# Patient Record
Sex: Male | Born: 1960 | Race: White | Hispanic: No | Marital: Married | Smoking: Never smoker
Health system: Southern US, Community
[De-identification: ages and names within clinical notes are randomized; demographics above are authoritative.]

## PROBLEM LIST (undated history)

## (undated) DIAGNOSIS — Z860100 Personal history of colon polyps, unspecified: Secondary | ICD-10-CM

## (undated) DIAGNOSIS — E039 Hypothyroidism, unspecified: Secondary | ICD-10-CM

## (undated) DIAGNOSIS — M1990 Unspecified osteoarthritis, unspecified site: Secondary | ICD-10-CM

## (undated) DIAGNOSIS — G709 Myoneural disorder, unspecified: Secondary | ICD-10-CM

## (undated) HISTORY — DX: Personal history of colon polyps, unspecified: Z86.0100

## (undated) HISTORY — PX: TONSILLECTOMY: SUR1361

## (undated) HISTORY — PX: WRIST SURGERY: SHX841

## (undated) HISTORY — DX: Myoneural disorder, unspecified: G70.9

## (undated) HISTORY — DX: Unspecified osteoarthritis, unspecified site: M19.90

## (undated) HISTORY — PX: ELBOW SURGERY: SHX618

## (undated) HISTORY — DX: Personal history of colonic polyps: Z86.010

---

## 2011-01-04 ENCOUNTER — Ambulatory Visit: Payer: Self-pay | Admitting: Internal Medicine

## 2011-02-15 ENCOUNTER — Ambulatory Visit: Payer: Self-pay | Admitting: General Surgery

## 2011-02-15 HISTORY — PX: COLONOSCOPY: SHX174

## 2011-02-18 LAB — PATHOLOGY REPORT

## 2012-08-13 ENCOUNTER — Encounter: Payer: Self-pay | Admitting: *Deleted

## 2015-01-29 HISTORY — PX: EYE SURGERY: SHX253

## 2015-05-01 ENCOUNTER — Other Ambulatory Visit: Payer: Self-pay

## 2015-05-01 ENCOUNTER — Encounter: Payer: Self-pay | Admitting: Physician Assistant

## 2015-05-01 ENCOUNTER — Ambulatory Visit (INDEPENDENT_AMBULATORY_CARE_PROVIDER_SITE_OTHER): Payer: BLUE CROSS/BLUE SHIELD | Admitting: Physician Assistant

## 2015-05-01 VITALS — BP 120/78 | HR 62 | Temp 98.3°F | Resp 14 | Wt 285.6 lb

## 2015-05-01 DIAGNOSIS — R220 Localized swelling, mass and lump, head: Secondary | ICD-10-CM | POA: Diagnosis not present

## 2015-05-01 MED ORDER — AMOXICILLIN-POT CLAVULANATE 875-125 MG PO TABS: 1.0000 | ORAL_TABLET | Freq: Two times a day (BID) | ORAL | Status: DC

## 2015-05-01 NOTE — Progress Notes (Signed)
Patient ID: Mason Fisher, male   DOB: 24-Apr-1960, 55 y.o.   MRN: [id number]   Patient: Mason Fisher Male    DOB: 24-Apr-1960   55 y.o.   MRN: [id number] Visit Date: 05/01/2015  Today's Provider: Margaretann LovelessJennifer M Alianis Trimmer, PA-C   Chief Complaint  Patient presents with  . Mass   Subjective:    HPI Patient presents with a mass behind right ear X 2 days. Patient states the mass is painful to the touch. He does not remember hurting it or hitting his head. He denies any middle ear pain, hearing loss, dizziness, URI symptoms, fevers, chills, nausea or vomiting. Does have pain with turning head.   Previous Medications   LEVOTHYROXINE (SYNTHROID, LEVOTHROID) 75 MCG TABLET       No Known Allergies  Review of Systems  Constitutional: Negative.   HENT: Positive for ear pain (behind right ear). Negative for congestion, ear discharge, hearing loss, postnasal drip, sinus pressure, sneezing, sore throat and tinnitus.   Eyes: Negative.   Respiratory: Negative.   Cardiovascular: Negative.   Gastrointestinal: Negative.   Endocrine: Negative.   Genitourinary: Negative.   Musculoskeletal: Negative.   Skin: Negative.        Mass behind right ear   Allergic/Immunologic: Negative.   Neurological: Negative.   Hematological: Negative.   Psychiatric/Behavioral: Negative.     Social History  Substance Use Topics  . Smoking status: Never Smoker   . Smokeless tobacco: Never Used  . Alcohol Use: No   Objective:   BP 120/78 mmHg  Pulse 62  Temp(Src) 98.3 F (36.8 C) (Oral)  Resp 14  Wt 285 lb 9.6 oz (129.547 kg)  Physical Exam  Constitutional: He appears well-developed and well-nourished. No distress.  HENT:  Head: Normocephalic and atraumatic.    Right Ear: Hearing, tympanic membrane, external ear and ear canal normal. Tympanic membrane is not erythematous and not bulging. No middle ear effusion.  Left Ear: Hearing, tympanic membrane, external ear and ear canal normal. Tympanic membrane is  not erythematous and not bulging.  No middle ear effusion.  Nose: Nose normal. Right sinus exhibits no maxillary sinus tenderness and no frontal sinus tenderness. Left sinus exhibits no maxillary sinus tenderness and no frontal sinus tenderness.  Mouth/Throat: Uvula is midline, oropharynx is clear and moist and mucous membranes are normal. No oropharyngeal exudate, posterior oropharyngeal edema or posterior oropharyngeal erythema.  Eyes: Conjunctivae and EOM are normal. Pupils are equal, round, and reactive to light. Right eye exhibits no discharge. Left eye exhibits no discharge.  Neck: Normal range of motion. Neck supple. No tracheal deviation present. No Brudzinski's sign and no Kernig's sign noted. No thyromegaly present.  Cardiovascular: Normal rate, regular rhythm and normal heart sounds.  Exam reveals no gallop and no friction rub.   No murmur heard. Pulmonary/Chest: Effort normal and breath sounds normal. No stridor. No respiratory distress. He has no wheezes. He has no rales.  Lymphadenopathy:    He has no cervical adenopathy.  Skin: Skin is warm and dry. He is not diaphoretic.  Vitals reviewed.       Assessment & Plan:     1. Localized swelling, mass, and lump of head Possible infectious process since lymph node is also enlarged.  Will give augmentin as below. I did advise him he may take IBU for pain and inflammation. Advised to call Dr. Arlana Pouchate by Thursday if still swollen for consideration of US for further evaluation.  - amoxicillin-clavulanate (AUGMENTIN) 875-125 MG tablet; Take 1  tablet by mouth 2 (two) times daily.  Dispense: 14 tablet; Refill: 0   Follow up: No Follow-up on file.

## 2015-05-01 NOTE — Patient Instructions (Signed)
Amoxicillin; Clavulanic Acid tablets  What is this medicine?  AMOXICILLIN; CLAVULANIC ACID (a mox i SIL in; KLAV yoo lan ic AS id) is a penicillin antibiotic. It is used to treat certain kinds of bacterial infections. It will not work for colds, flu, or other viral infections.  This medicine may be used for other purposes; ask your health care provider or pharmacist if you have questions.  What should I tell my health care provider before I take this medicine?  They need to know if you have any of these conditions:  -bowel disease, like colitis  -kidney disease  -liver disease  -mononucleosis  -an unusual or allergic reaction to amoxicillin, penicillin, cephalosporin, other antibiotics, clavulanic acid, other medicines, foods, dyes, or preservatives  -pregnant or trying to get pregnant  -breast-feeding  How should I use this medicine?  Take this medicine by mouth with a full glass of water. Follow the directions on the prescription label. Take at the start of a meal. Do not crush or chew. If the tablet has a score line, you may cut it in half at the score line for easier swallowing. Take your medicine at regular intervals. Do not take your medicine more often than directed. Take all of your medicine as directed even if you think you are better. Do not skip doses or stop your medicine early.  Talk to your pediatrician regarding the use of this medicine in children. Special care may be needed.  Overdosage: If you think you have taken too much of this medicine contact a poison control center or emergency room at once.  NOTE: This medicine is only for you. Do not share this medicine with others.  What if I miss a dose?  If you miss a dose, take it as soon as you can. If it is almost time for your next dose, take only that dose. Do not take double or extra doses.  What may interact with this medicine?  -allopurinol  -anticoagulants  -birth control pills  -methotrexate  -probenecid  This list may not describe all possible  interactions. Give your health care provider a list of all the medicines, herbs, non-prescription drugs, or dietary supplements you use. Also tell them if you smoke, drink alcohol, or use illegal drugs. Some items may interact with your medicine.  What should I watch for while using this medicine?  Tell your doctor or health care professional if your symptoms do not improve.  Do not treat diarrhea with over the counter products. Contact your doctor if you have diarrhea that lasts more than 2 days or if it is severe and watery.  If you have diabetes, you may get a false-positive result for sugar in your urine. Check with your doctor or health care professional.  Birth control pills may not work properly while you are taking this medicine. Talk to your doctor about using an extra method of birth control.  What side effects may I notice from receiving this medicine?  Side effects that you should report to your doctor or health care professional as soon as possible:  -allergic reactions like skin rash, itching or hives, swelling of the face, lips, or tongue  -breathing problems  -dark urine  -fever or chills, sore throat  -redness, blistering, peeling or loosening of the skin, including inside the mouth  -seizures  -trouble passing urine or change in the amount of urine  -unusual bleeding, bruising  -unusually weak or tired  -white patches or sores in the mouth   or throat  Side effects that usually do not require medical attention (report to your doctor or health care professional if they continue or are bothersome):  -diarrhea  -dizziness  -headache  -nausea, vomiting  -stomach upset  -vaginal or anal irritation  This list may not describe all possible side effects. Call your doctor for medical advice about side effects. You may report side effects to FDA at 1-800-FDA-1088.  Where should I keep my medicine?  Keep out of the reach of children.  Store at room temperature below 25 degrees C (77 degrees F). Keep container  tightly closed. Throw away any unused medicine after the expiration date.  NOTE: This sheet is a summary. It may not cover all possible information. If you have questions about this medicine, talk to your doctor, pharmacist, or health care provider.     © 2016, Elsevier/[employer] Standard. (2007-04-09 12:04:30)

## 2015-12-20 ENCOUNTER — Encounter: Payer: Self-pay | Admitting: *Deleted

## 2016-01-30 ENCOUNTER — Encounter: Payer: Self-pay | Admitting: *Deleted

## 2016-02-05 ENCOUNTER — Ambulatory Visit: Payer: Self-pay | Admitting: General Surgery

## 2016-02-19 ENCOUNTER — Ambulatory Visit: Payer: Self-pay | Admitting: General Surgery

## 2016-03-05 ENCOUNTER — Ambulatory Visit: Payer: Self-pay | Admitting: General Surgery

## 2016-08-15 ENCOUNTER — Encounter: Payer: Self-pay | Admitting: *Deleted

## 2016-08-20 ENCOUNTER — Ambulatory Visit: Payer: Self-pay | Admitting: General Surgery

## 2016-11-06 ENCOUNTER — Encounter: Payer: Self-pay | Admitting: *Deleted

## 2016-12-10 ENCOUNTER — Other Ambulatory Visit: Payer: Self-pay | Admitting: Internal Medicine

## 2016-12-10 DIAGNOSIS — K859 Acute pancreatitis without necrosis or infection, unspecified: Secondary | ICD-10-CM

## 2016-12-10 DIAGNOSIS — R748 Abnormal levels of other serum enzymes: Secondary | ICD-10-CM

## 2016-12-11 ENCOUNTER — Encounter: Payer: Self-pay | Admitting: *Deleted

## 2016-12-17 ENCOUNTER — Ambulatory Visit: Admission: RE | Admit: 2016-12-17 | Payer: BLUE CROSS/BLUE SHIELD | Source: Ambulatory Visit

## 2016-12-25 ENCOUNTER — Ambulatory Visit: Payer: BLUE CROSS/BLUE SHIELD | Admitting: General Surgery

## 2016-12-25 ENCOUNTER — Other Ambulatory Visit: Payer: Self-pay | Admitting: Internal Medicine

## 2016-12-25 ENCOUNTER — Encounter: Payer: Self-pay | Admitting: General Surgery

## 2016-12-25 VITALS — BP 134/80 | HR 70 | Resp 14 | Ht 74.0 in | Wt 293.0 lb

## 2016-12-25 DIAGNOSIS — Z86010 Personal history of colonic polyps: Secondary | ICD-10-CM | POA: Insufficient documentation

## 2016-12-25 DIAGNOSIS — K859 Acute pancreatitis without necrosis or infection, unspecified: Secondary | ICD-10-CM

## 2016-12-25 DIAGNOSIS — R748 Abnormal levels of other serum enzymes: Secondary | ICD-10-CM

## 2016-12-25 MED ORDER — POLYETHYLENE GLYCOL 3350 17 GM/SCOOP PO POWD: ORAL | 0 refills | Status: DC

## 2016-12-25 NOTE — Patient Instructions (Signed)
Colonoscopy, Adult A colonoscopy is an exam to look at the entire large intestine. During the exam, a lubricated, bendable tube is inserted into the anus and then passed into the rectum, colon, and other parts of the large intestine. A colonoscopy is often done as a part of normal colorectal screening or in response to certain symptoms, such as anemia, persistent diarrhea, abdominal pain, and blood in the stool. The exam can help screen for and diagnose medical problems, including:  Tumors.  Polyps.  Inflammation.  Areas of bleeding.  Tell a health care provider about:  Any allergies you have.  All medicines you are taking, including vitamins, herbs, eye drops, creams, and over-the-counter medicines.  Any problems you or family members have had with anesthetic medicines.  Any blood disorders you have.  Any surgeries you have had.  Any medical conditions you have.  Any problems you have had passing stool. What are the risks? Generally, this is a safe procedure. However, problems may occur, including:  Bleeding.  A tear in the intestine.  A reaction to medicines given during the exam.  Infection (rare).  What happens before the procedure? Eating and drinking restrictions Follow instructions from your health care provider about eating and drinking, which may include:  A few days before the procedure - follow a low-fiber diet. Avoid nuts, seeds, dried fruit, raw fruits, and vegetables.  1-3 days before the procedure - follow a clear liquid diet. Drink only clear liquids, such as clear broth or bouillon, black coffee or tea, clear juice, clear soft drinks or sports drinks, gelatin dessert, and popsicles. Avoid any liquids that contain red or purple dye.  On the day of the procedure - do not eat or drink anything during the 2 hours before the procedure, or within the time period that your health care provider recommends.  Bowel prep If you were prescribed an oral bowel prep  to clean out your colon:  Take it as told by your health care provider. Starting the day before your procedure, you will need to drink a large amount of medicated liquid. The liquid will cause you to have multiple loose stools until your stool is almost clear or light green.  If your skin or anus gets irritated from diarrhea, you may use these to relieve the irritation: ? Medicated wipes, such as adult wet wipes with aloe and vitamin E. ? A skin soothing-product like petroleum jelly.  If you vomit while drinking the bowel prep, take a break for up to 60 minutes and then begin the bowel prep again. If vomiting continues and you cannot take the bowel prep without vomiting, call your health care provider.  General instructions  Ask your health care provider about changing or stopping your regular medicines. This is especially important if you are taking diabetes medicines or blood thinners.  Plan to have someone take you home from the hospital or clinic. What happens during the procedure?  An IV tube may be inserted into one of your veins.  You will be given medicine to help you relax (sedative).  To reduce your risk of infection: ? Your health care team will wash or sanitize their hands. ? Your anal area will be washed with soap.  You will be asked to lie on your side with your knees bent.  Your health care provider will lubricate a long, thin, flexible tube. The tube will have a camera and a light on the end.  The tube will be inserted into your   anus.  The tube will be gently eased through your rectum and colon.  Air will be delivered into your colon to keep it open. You may feel some pressure or cramping.  The camera will be used to take images during the procedure.  A small tissue sample may be removed from your body to be examined under a microscope (biopsy). If any potential problems are found, the tissue will be sent to a lab for testing.  If small polyps are found, your  health care provider may remove them and have them checked for cancer cells.  The tube that was inserted into your anus will be slowly removed. The procedure may vary among health care providers and hospitals. What happens after the procedure?  Your blood pressure, heart rate, breathing rate, and blood oxygen level will be monitored until the medicines you were given have worn off.  Do not drive for 24 hours after the exam.  You may have a small amount of blood in your stool.  You may pass gas and have mild abdominal cramping or bloating due to the air that was used to inflate your colon during the exam.  It is up to you to get the results of your procedure. Ask your health care provider, or the department performing the procedure, when your results will be ready. This information is not intended to replace advice given to you by your health care provider. Make sure you discuss any questions you have with your health care provider. Document Released: 01/12/2000 Document Revised: 11/15/2015 Document Reviewed: 03/28/2015 Elsevier Interactive Patient Education  2018 Elsevier Inc.  

## 2016-12-25 NOTE — Progress Notes (Signed)
Patient ID: Mason Fisher, male   DOB: 12/03/1960, 56 y.o.   MRN: [id number]  Chief Complaint  Patient presents with  . Colonoscopy    HPI Mason Fisher is a 56 y.o. male here today for a evaluation of a colonoscopy. Last colonoscopy was done 02/15/2011.  The patient reports he had 2 episodes of abdominal bloating associated with watery stools occurring every 15 minutes. During these episodes he did not appreciate any blood or mucus. The first episode lasted 4-5 days, he was well for a week or more and then had a recurrent episode that lasted a somewhat shorter interval. No associated nausea or vomiting.     HPI  Past Medical History:  Diagnosis Date  . Personal history of colonic polyps     Past Surgical History:  Procedure Laterality Date  . COLONOSCOPY  02/15/2011  . ELBOW SURGERY Right   . EYE SURGERY  2017  . TONSILLECTOMY    . WRIST SURGERY Right     History reviewed. No pertinent family history.  Social History Social History   Tobacco Use  . Smoking status: Never Smoker  . Smokeless tobacco: Never Used  Substance Use Topics  . Alcohol use: No  . Drug use: No    No Known Allergies  Current Outpatient Medications  Medication Sig Dispense Refill  . levothyroxine (SYNTHROID, LEVOTHROID) 75 MCG tablet   2  . polyethylene glycol powder (GLYCOLAX/MIRALAX) powder 255 grams one bottle for colonoscopy prep 255 g 0   No current facility-administered medications for this visit.     Review of Systems Review of Systems  Constitutional: Negative.   Respiratory: Negative.   Cardiovascular: Negative.   Gastrointestinal: Positive for diarrhea (with associated upper abdominal bloating 2).    Blood pressure 134/80, pulse 70, resp. rate 14, height 6\' 2"  (1.88 m), weight 293 lb (132.9 kg).  Physical Exam Physical Exam  Constitutional: He is oriented to person, place, and time. He appears well-developed and well-nourished.  Cardiovascular: Normal rate, regular  rhythm and normal heart sounds.  Pulmonary/Chest: Effort normal and breath sounds normal.  Neurological: He is alert and oriented to person, place, and time.  Skin: Skin is warm and dry.    Data Reviewed 02/15/2011 colonoscopy: Diagnosis:  SIGMOID COLON POLYP HOT SNARED:  - TUBULAR ADENOMA.  - NEGATIVE FOR HIGH GRADE DYSPLASIA AND MALIGNANCY.  At the time of his endoscopy, the sessile polyp appeared to be 10 mm in diameter.  Assessment    History tubular adenoma in the sigmoid colon.  Recent episode of abdominal bloating associated with diarrhea. Infectious versus ischemic versus inflammatory process.    Plan         Colonoscopy with possible biopsy/polypectomy prn: Information regarding the procedure, including its potential risks and complications (including but not limited to perforation of the bowel, which may require emergency surgery to repair, and bleeding) was verbally given to the patient. Educational information regarding lower intestinal endoscopy was given to the patient. Written instructions for how to complete the bowel prep using Miralax were provided. The importance of drinking ample fluids to avoid dehydration as a result of the prep emphasized.  HPI, Physical Exam, Assessment and Plan have been scribed under the direction and in the presence of Donnalee CurryJeffrey Dominion Kathan, MD.  Ples SpecterJessica Qualls, CMA  I have completed the exam and reviewed the above documentation for accuracy and completeness.  I agree with the above.  [employer] has been used and any errors in dictation or transcription are  unintentional.  Donnalee CurryJeffrey Abigal Choung, M.D., F.A.C.S.    Earline MayotteByrnett, Nery Kalisz W 12/25/2016, 8:52 PM  [facility] Miralax prescription has been sent in to the patient's pharmacy today. Colonoscopy instructions have been reviewed with the patient. This patient is aware to call the office if they have further questions.   Mason Fisher, CMA

## 2016-12-30 ENCOUNTER — Ambulatory Visit
Admission: RE | Admit: 2016-12-30 | Discharge: 2016-12-30 | Disposition: A | Discharge status: Home / Self Care | Payer: BLUE CROSS/BLUE SHIELD | Source: Ambulatory Visit | Attending: Internal Medicine | Admitting: Internal Medicine

## 2016-12-30 DIAGNOSIS — K760 Fatty (change of) liver, not elsewhere classified: Secondary | ICD-10-CM | POA: Diagnosis not present

## 2016-12-30 DIAGNOSIS — K859 Acute pancreatitis without necrosis or infection, unspecified: Secondary | ICD-10-CM | POA: Diagnosis not present

## 2016-12-30 DIAGNOSIS — R109 Unspecified abdominal pain: Secondary | ICD-10-CM | POA: Diagnosis not present

## 2016-12-30 DIAGNOSIS — R748 Abnormal levels of other serum enzymes: Secondary | ICD-10-CM

## 2017-01-01 ENCOUNTER — Encounter: Payer: Self-pay | Admitting: General Surgery

## 2017-01-15 ENCOUNTER — Encounter: Payer: Self-pay | Admitting: *Deleted

## 2017-01-15 ENCOUNTER — Ambulatory Visit: Payer: BLUE CROSS/BLUE SHIELD | Admitting: Anesthesiology

## 2017-01-15 ENCOUNTER — Encounter
Admission: RE | Disposition: A | Discharge status: Home / Self Care | Payer: Self-pay | Source: Ambulatory Visit | Attending: General Surgery

## 2017-01-15 ENCOUNTER — Ambulatory Visit
Admission: RE | Admit: 2017-01-15 | Discharge: 2017-01-15 | Disposition: A | Discharge status: Home / Self Care | Payer: BLUE CROSS/BLUE SHIELD | Source: Ambulatory Visit | Attending: General Surgery | Admitting: General Surgery

## 2017-01-15 DIAGNOSIS — Z86010 Personal history of colonic polyps: Secondary | ICD-10-CM | POA: Diagnosis not present

## 2017-01-15 DIAGNOSIS — Z79899 Other long term (current) drug therapy: Secondary | ICD-10-CM | POA: Insufficient documentation

## 2017-01-15 DIAGNOSIS — E039 Hypothyroidism, unspecified: Secondary | ICD-10-CM | POA: Insufficient documentation

## 2017-01-15 DIAGNOSIS — K5730 Diverticulosis of large intestine without perforation or abscess without bleeding: Secondary | ICD-10-CM | POA: Insufficient documentation

## 2017-01-15 DIAGNOSIS — Z1211 Encounter for screening for malignant neoplasm of colon: Secondary | ICD-10-CM | POA: Diagnosis present

## 2017-01-15 HISTORY — PX: COLONOSCOPY WITH PROPOFOL: SHX5780

## 2017-01-15 HISTORY — DX: Hypothyroidism, unspecified: E03.9

## 2017-01-15 SURGERY — COLONOSCOPY WITH PROPOFOL
Anesthesia: General

## 2017-01-15 MED ORDER — SODIUM CHLORIDE 0.9 % IV SOLN
INTRAVENOUS | Status: DC
  Administered 2017-01-15: 1000 mL via INTRAVENOUS

## 2017-01-15 MED ORDER — PROPOFOL 500 MG/50ML IV EMUL
INTRAVENOUS | Status: AC
  Filled 2017-01-15: qty 50

## 2017-01-15 MED ORDER — FENTANYL CITRATE (PF) 100 MCG/2ML IJ SOLN
INTRAMUSCULAR | Status: AC
  Filled 2017-01-15: qty 2

## 2017-01-15 MED ORDER — PROPOFOL 500 MG/50ML IV EMUL
INTRAVENOUS | Status: DC | PRN
  Administered 2017-01-15: 160 ug/kg/min via INTRAVENOUS

## 2017-01-15 MED ORDER — LIDOCAINE 2% (20 MG/ML) 5 ML SYRINGE
INTRAMUSCULAR | Status: DC | PRN
  Administered 2017-01-15: 40 mg via INTRAVENOUS

## 2017-01-15 MED ORDER — PROPOFOL 10 MG/ML IV BOLUS
INTRAVENOUS | Status: DC | PRN
  Administered 2017-01-15: 100 mg via INTRAVENOUS

## 2017-01-15 MED ORDER — FENTANYL CITRATE (PF) 100 MCG/2ML IJ SOLN
INTRAMUSCULAR | Status: DC | PRN
Start: 2017-01-15 — End: 2017-01-15
  Administered 2017-01-15 (×2): 50 ug via INTRAVENOUS

## 2017-01-15 NOTE — Anesthesia Preprocedure Evaluation (Signed)
Anesthesia Evaluation  Patient identified by MRN, date of birth, ID band Patient awake    Reviewed: Allergy & Precautions, NPO status , Patient's Chart, lab work & pertinent test results  Airway Mallampati: II       Dental  (+) Teeth Intact   Pulmonary neg pulmonary ROS,    breath sounds clear to auscultation       Cardiovascular Exercise Tolerance: Good  Rhythm:Regular Rate:Normal     Neuro/Psych negative psych ROS   GI/Hepatic negative GI ROS, Neg liver ROS,   Endo/Other  Hypothyroidism Morbid obesity  Renal/GU negative Renal ROS     Musculoskeletal negative musculoskeletal ROS (+)   Abdominal (+) + obese,   Peds negative pediatric ROS (+)  Hematology   Anesthesia Other Findings   Reproductive/Obstetrics                             Anesthesia Physical Anesthesia Plan  ASA: II  Anesthesia Plan: General   Post-op Pain Management:    Induction: Intravenous  PONV Risk Score and Plan:   Airway Management Planned: Natural Airway and Nasal Cannula  Additional Equipment:   Intra-op Plan:   Post-operative Plan:   Informed Consent: I have reviewed the patients History and Physical, chart, labs and discussed the procedure including the risks, benefits and alternatives for the proposed anesthesia with the patient or authorized representative who has indicated his/her understanding and acceptance.     Plan Discussed with: CRNA  Anesthesia Plan Comments:         Anesthesia Quick Evaluation

## 2017-01-15 NOTE — Op Note (Signed)
[facility] Gastroenterology Patient Name: Mason CheeseDonald Ballow Procedure Date: 01/15/2017 10:51 AM MRN: [id number] Account #: [id number] Date of Birth: Jan 21, 1961 Admit Type: Outpatient Age: [id number] [facility]OM 1 Gender: Male Note Status: Finalized Procedure:            Colonoscopy Indications:          High risk colon cancer surveillance: Personal history                        of colonic polyps, Incidental - Clinically significant                        diarrhea of unexplained origin Providers:            Earline MayotteJeffrey W. Nataki Mccrumb, MD Referring MD:         Jillene Bucksenny C. Arlana Pouchate, MD (Referring MD) Medicines:            Monitored Anesthesia Care Complications:        No immediate complications. Procedure:            Pre-Anesthesia Assessment:                       - Prior to the procedure, a History and Physical was                        performed, and patient medications, allergies and                        sensitivities were reviewed. The patient's tolerance of                        previous anesthesia was reviewed.                       - The risks and benefits of the procedure and the                        sedation options and risks were discussed with the                        patient. All questions were answered and informed                        consent was obtained.                       After obtaining informed consent, the colonoscope was                        passed under direct vision. Throughout the procedure,                        the patient's blood pressure, pulse, and oxygen                        saturations were monitored continuously. The                        Colonoscope was introduced through the anus and  advanced to the the cecum, identified by appendiceal                        orifice and ileocecal valve. The colonoscopy was                        performed without difficulty. The colonoscopy was   somewhat difficult due to a tortuous colon. Successful                        completion of the procedure was aided by changing the                        patient to a supine position and using manual pressure.                        The patient tolerated the procedure well. The quality                        of the bowel preparation was excellent. Findings:      Many small-mouthed diverticula were found in the sigmoid colon,       transverse colon and hepatic flexure.      The retroflexed view of the distal rectum and anal verge was normal and       showed no anal or rectal abnormalities. Impression:           - Diverticulosis in the sigmoid colon, in the                        transverse colon and at the hepatic flexure.                       - The distal rectum and anal verge are normal on                        retroflexion view.                       - No specimens collected. Recommendation:       - Telephone endoscopist for pathology results in 1 week. Procedure Code(s):    --- Professional ---                       [phone], Colonoscopy, flexible; diagnostic, including                        collection of specimen(s) by brushing or washing, when                        performed (separate procedure) Diagnosis Code(s):    --- Professional ---                       K57.30, Diverticulosis of large intestine without                        perforation or abscess without bleeding                       Z86.010, Personal history of colonic polyps CPT copyright 2016 American Medical Association. All rights reserved. The codes  documented in this report are preliminary and upon coder review may  be revised to meet current compliance requirements. Earline MayotteJeffrey W. Hadley Soileau, MD 01/15/2017 11:31:30 AM This report has been signed electronically. Number of Addenda: 0 Note Initiated On: 01/15/2017 10:51 AM Scope Withdrawal Time: 0 hours 11 minutes 28 seconds  Total Procedure Duration: 0 hours 28 minutes  40 seconds       [facility]

## 2017-01-15 NOTE — Anesthesia Post-op Follow-up Note (Signed)
Anesthesia QCDR form completed.        

## 2017-01-15 NOTE — H&P (Signed)
Mason CheeseDonald Fisher [id number] 12-30-1960     HPI: 56 year old male who underwent a colonoscopy in 2013 with identification of tubular adenoma in the sigmoid colon.  Recently he has had 2 episodes of watery stools without mucus or blood.  Follow-up colonoscopy.  The patient reports tolerating the prep well.  Medications Prior to Admission  Medication Sig Dispense Refill Last Dose  . levothyroxine (SYNTHROID, LEVOTHROID) 75 MCG tablet   2 Taking  . polyethylene glycol powder (GLYCOLAX/MIRALAX) powder 255 grams one bottle for colonoscopy prep 255 g 0    No Known Allergies Past Medical History:  Diagnosis Date  . Hypothyroidism   . Personal history of colonic polyps    Past Surgical History:  Procedure Laterality Date  . COLONOSCOPY  02/15/2011  . ELBOW SURGERY Right   . EYE SURGERY  2017  . TONSILLECTOMY    . WRIST SURGERY Right    Social History   Socioeconomic History  . Marital status: Married    Spouse name: Not on file  . Number of children: Not on file  . Years of education: Not on file  . Highest education level: Not on file  Social Needs  . Financial resource strain: Not on file  . Food insecurity - worry: Not on file  . Food insecurity - inability: Not on file  . Transportation needs - medical: Not on file  . Transportation needs - non-medical: Not on file  Occupational History  . Not on file  Tobacco Use  . Smoking status: Never Smoker  . Smokeless tobacco: Never Used  Substance and Sexual Activity  . Alcohol use: No  . Drug use: No  . Sexual activity: Not on file  Other Topics Concern  . Not on file  Social History Narrative  . Not on file   Social History   Social History Narrative  . Not on file     ROS: Negative.     PE: HEENT: Negative. Lungs: Clear. Cardio: RR.   Assessment/Plan:  Proceed with planned endoscopy.  Earline MayotteByrnett, Algenis Ballin W 01/15/2017

## 2017-01-15 NOTE — Anesthesia Postprocedure Evaluation (Signed)
Anesthesia Post Note  Patient: Mason Fisher  Procedure(s) Performed: COLONOSCOPY WITH PROPOFOL (N/A )  Patient location during evaluation: PACU Anesthesia Type: General Level of consciousness: awake Pain management: pain level controlled Vital Signs Assessment: post-procedure vital signs reviewed and stable Respiratory status: spontaneous breathing Cardiovascular status: stable Anesthetic complications: no     Last Vitals:  Vitals:   01/15/17 1150 01/15/17 1200  BP: 116/70 125/81  Pulse: (!) 57 63  Resp: 10 (!) 8  Temp:    SpO2: 97% 96%    Last Pain:  Vitals:   01/15/17 1130  TempSrc: Tympanic                 VAN STAVEREN,Niemah Schwebke

## 2017-01-15 NOTE — Transfer of Care (Signed)
Immediate Anesthesia Transfer of Care Note  Patient: Mason Fisher  Procedure(s) Performed: COLONOSCOPY WITH PROPOFOL (N/A )  Patient Location: PACU and Endoscopy Unit  Anesthesia Type:General  Level of Consciousness: sedated  Airway & Oxygen Therapy: Patient Spontanous Breathing and Patient connected to nasal cannula oxygen  Post-op Assessment: Report given to RN and Post -op Vital signs reviewed and stable  Post vital signs: Reviewed and stable  Last Vitals:  Vitals:   01/15/17 1021 01/15/17 1130  BP: (!) 141/92 (P) 98/60  Pulse: 74 (P) 63  Resp: 17 (P) 18  Temp: 36.6 C (!) (P) 35.8 C  SpO2: 97% (P) 97%    Last Pain:  Vitals:   01/15/17 1130  TempSrc: (P) Tympanic         Complications: No apparent anesthesia complications

## 2017-01-16 ENCOUNTER — Encounter: Payer: Self-pay | Admitting: General Surgery

## 2017-01-17 LAB — SURGICAL PATHOLOGY

## 2017-01-18 ENCOUNTER — Telehealth: Payer: Self-pay | Admitting: General Surgery

## 2017-01-20 NOTE — Telephone Encounter (Signed)
Notified of benign biopsies. F/U exam in five years based on past colonoscopy showing adenomatous polyps.

## 2018-11-21 IMAGING — US US ABDOMEN COMPLETE
1 series · 14 of 25 positions shown · non-contrast
Comparison: 01/04/2011

CLINICAL DATA: Acute pancreatitis.  Elevated liver enzymes.

EXAM:
ABDOMEN ULTRASOUND COMPLETE

[Series 1: us abdomen complete · 0.23mm/px · 14 of 91 slices shown]
[im 1/91]
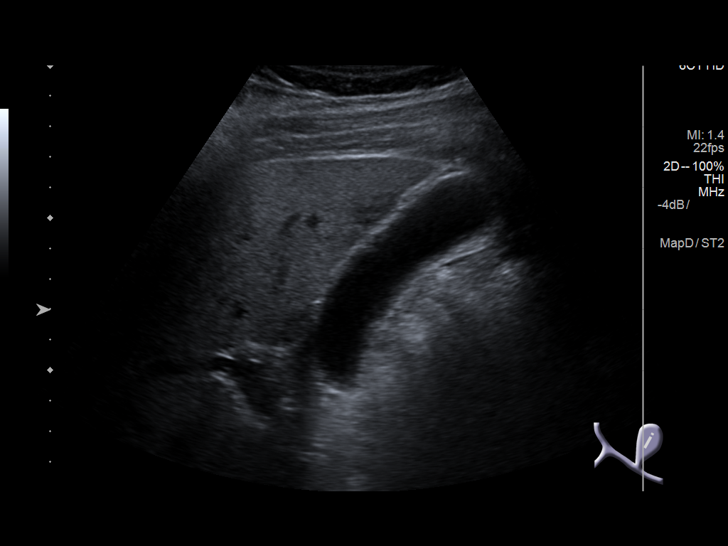
[im 8/91]
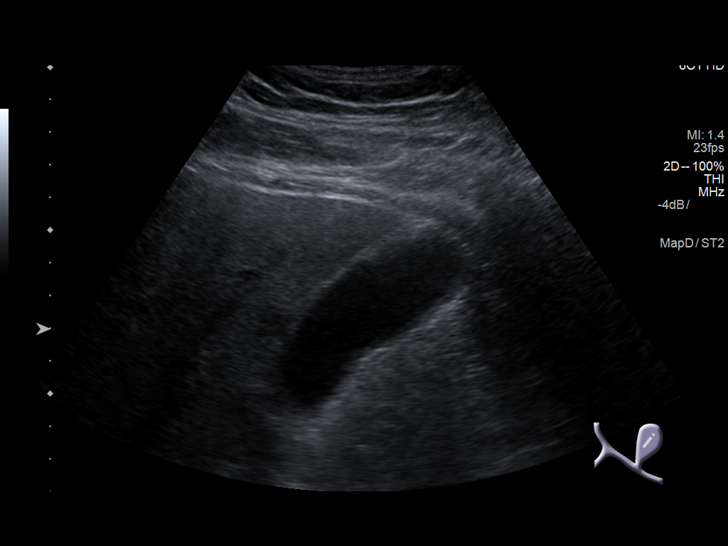
[im 16/91]
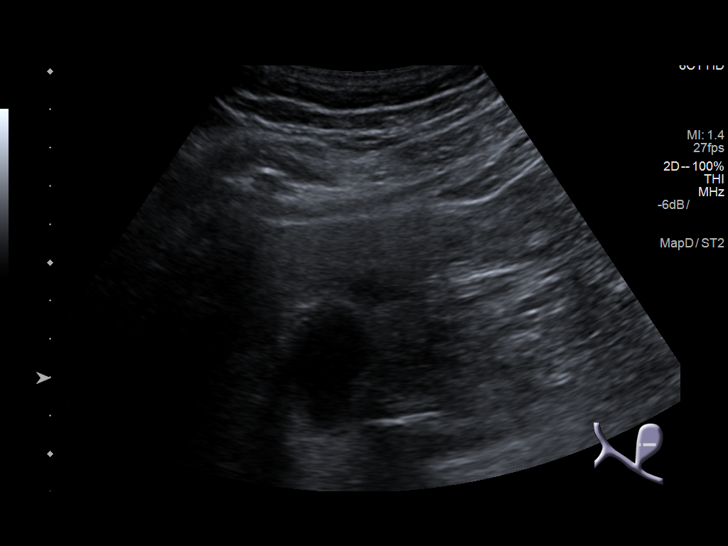
[im 23/91]
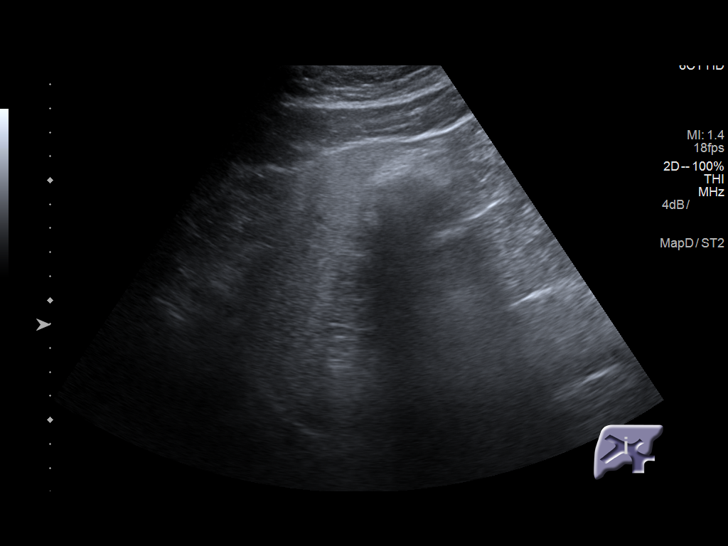
[im 31/91]
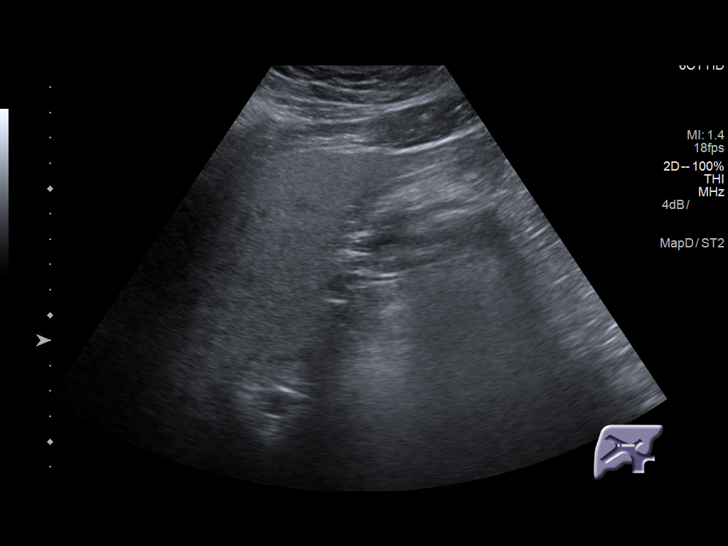
[im 34/91]
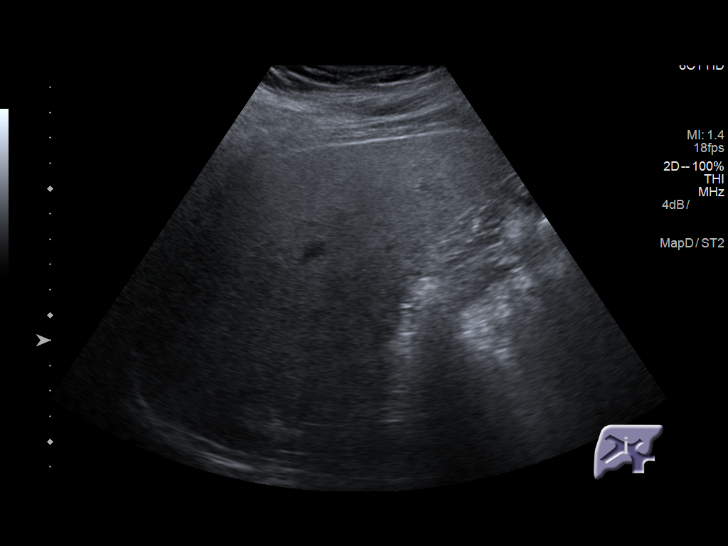
[im 42/91]
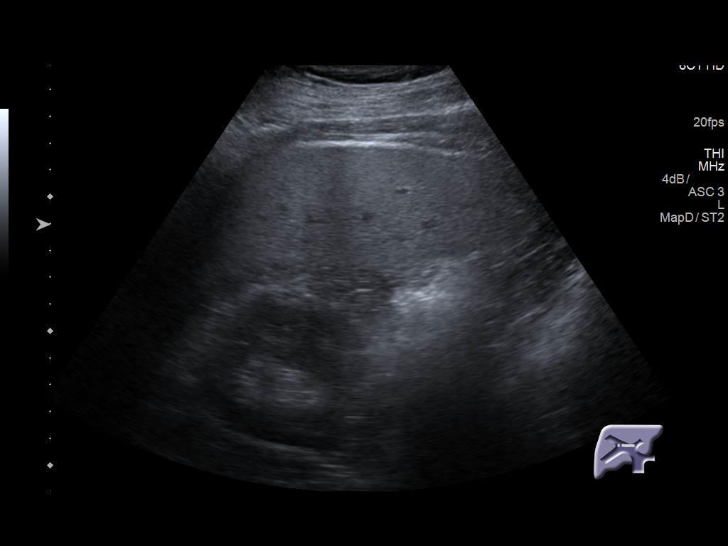
[im 49/91]
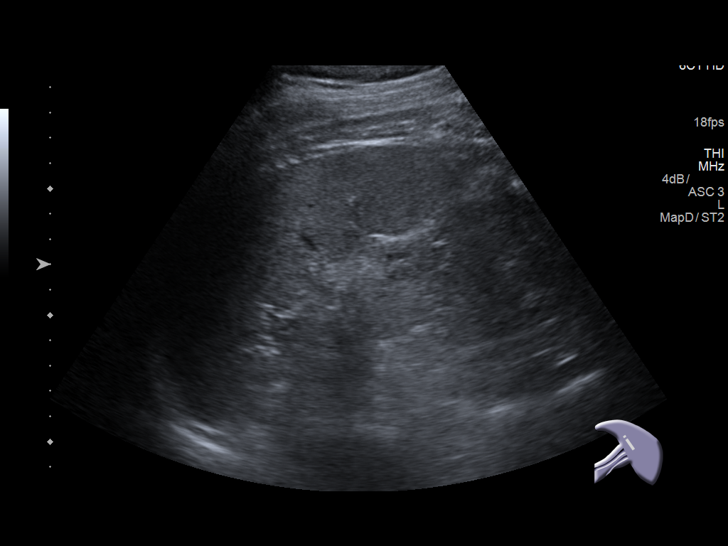
[im 57/91]
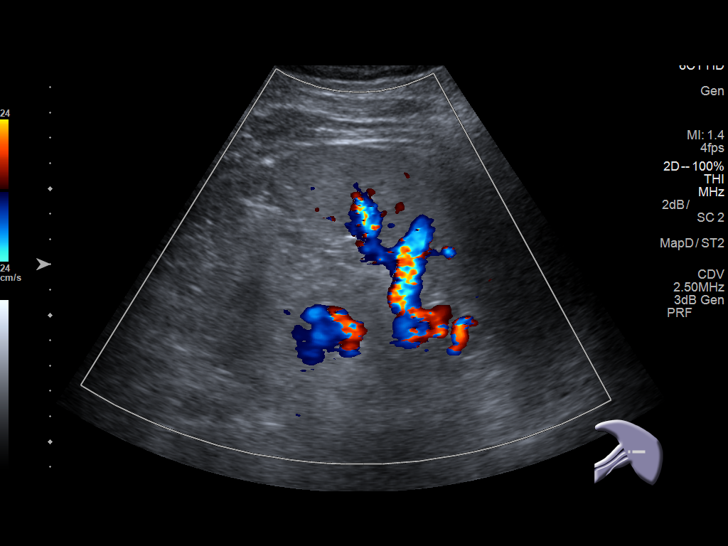
[im 61/91]
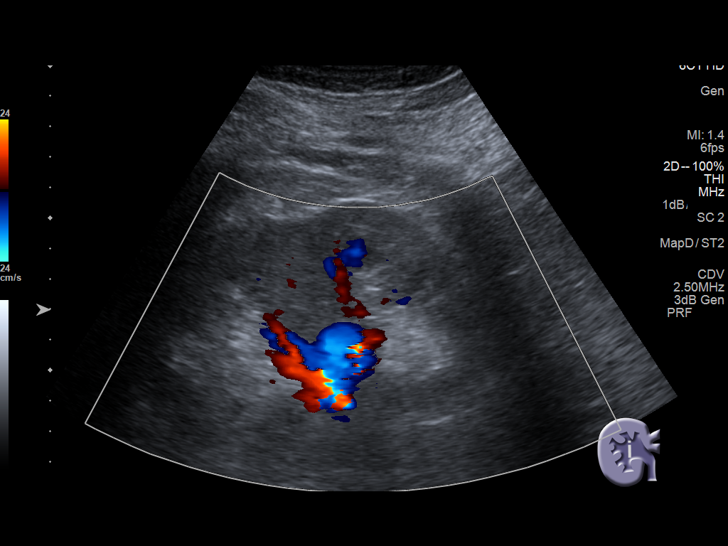
[im 68/91]
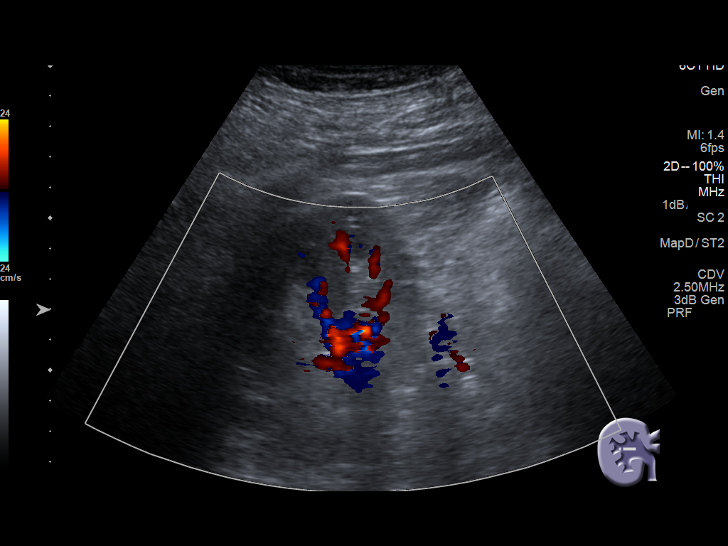
[im 76/91]
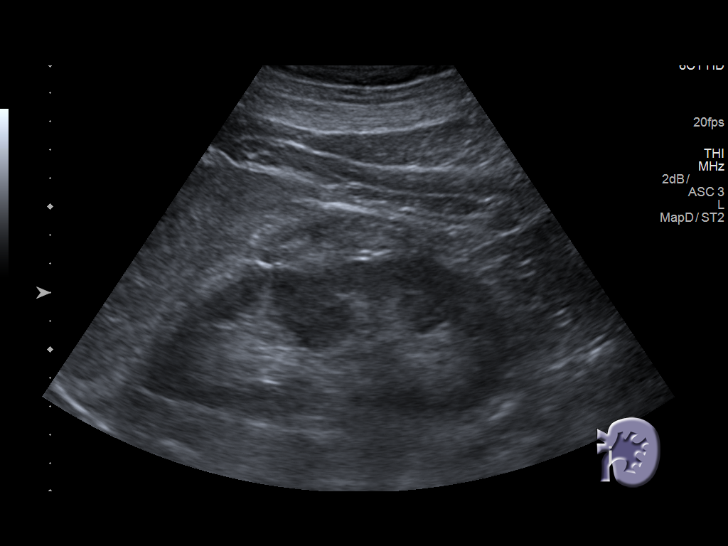
[im 83/91]
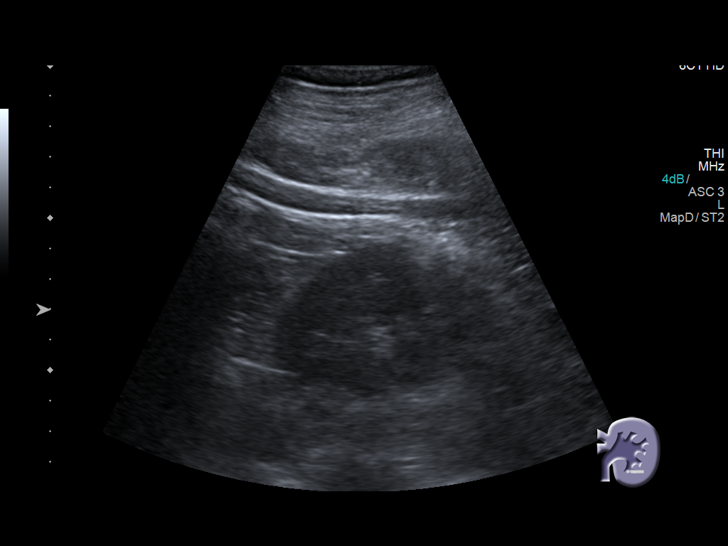
[im 91/91]
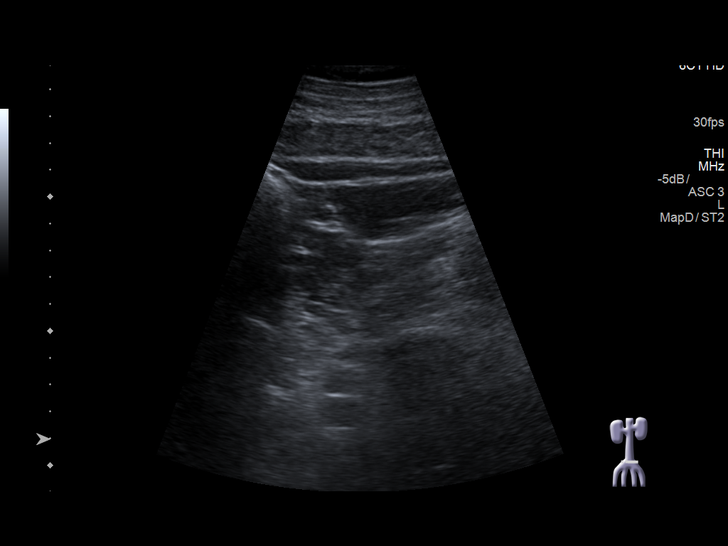

[14 of 25 positions shown; findings below may reference images not displayed]

FINDINGS: Gallbladder: Upper limits of normal gallbladder wall thickness at
3-4 mm without gallstones or pericholecystic fluid. No sonographic
Murphy sign noted by sonographer.

Common bile duct: Diameter: 5 mm

Liver: Increased parenchymal echogenicity diffusely suggesting
steatosis with probable fatty sparing in the gallbladder fossa.
Portal vein is patent on color Doppler imaging with normal direction
of blood flow towards the liver.

IVC: No abnormality visualized.

Pancreas: Poorly visualized due to bowel gas.

Spleen: Size and appearance within normal limits.

Right Kidney: Length: 12.3 cm. Echogenicity within normal limits. No
mass or hydronephrosis visualized.

Left Kidney: Length: 13.1 cm. Echogenicity within normal limits. No
mass or hydronephrosis visualized.

Abdominal aorta: No aneurysm visualized.

Other findings: None.
IMPRESSION: 1. Hepatic steatosis.
2. Poor visualization of the pancreas due to bowel gas.

## 2019-01-13 ENCOUNTER — Other Ambulatory Visit: Payer: Self-pay

## 2019-01-13 ENCOUNTER — Ambulatory Visit: Payer: BLUE CROSS/BLUE SHIELD | Attending: Internal Medicine

## 2019-01-13 DIAGNOSIS — Z20822 Contact with and (suspected) exposure to covid-19: Secondary | ICD-10-CM

## 2019-01-15 LAB — NOVEL CORONAVIRUS, NAA: SARS-CoV-2, NAA: NOT DETECTED

## 2019-08-10 ENCOUNTER — Encounter (INDEPENDENT_AMBULATORY_CARE_PROVIDER_SITE_OTHER): Payer: BLUE CROSS/BLUE SHIELD | Admitting: Ophthalmology

## 2019-11-10 DIAGNOSIS — H33022 Retinal detachment with multiple breaks, left eye: Secondary | ICD-10-CM | POA: Insufficient documentation

## 2019-11-10 DIAGNOSIS — Z961 Presence of intraocular lens: Secondary | ICD-10-CM | POA: Insufficient documentation

## 2019-11-10 DIAGNOSIS — H33021 Retinal detachment with multiple breaks, right eye: Secondary | ICD-10-CM | POA: Insufficient documentation

## 2019-11-10 DIAGNOSIS — H35371 Puckering of macula, right eye: Secondary | ICD-10-CM | POA: Insufficient documentation

## 2019-11-23 ENCOUNTER — Other Ambulatory Visit: Payer: Self-pay

## 2019-11-23 ENCOUNTER — Encounter (INDEPENDENT_AMBULATORY_CARE_PROVIDER_SITE_OTHER): Payer: Self-pay

## 2019-11-23 ENCOUNTER — Encounter (INDEPENDENT_AMBULATORY_CARE_PROVIDER_SITE_OTHER): Payer: BLUE CROSS/BLUE SHIELD | Admitting: Ophthalmology

## 2019-11-23 DIAGNOSIS — H33021 Retinal detachment with multiple breaks, right eye: Secondary | ICD-10-CM

## 2019-11-23 DIAGNOSIS — Z961 Presence of intraocular lens: Secondary | ICD-10-CM

## 2019-11-23 DIAGNOSIS — H33022 Retinal detachment with multiple breaks, left eye: Secondary | ICD-10-CM

## 2019-11-23 DIAGNOSIS — H35371 Puckering of macula, right eye: Secondary | ICD-10-CM

## 2021-11-19 ENCOUNTER — Other Ambulatory Visit: Payer: Self-pay

## 2021-11-19 ENCOUNTER — Telehealth: Payer: Self-pay

## 2021-11-19 DIAGNOSIS — Z1211 Encounter for screening for malignant neoplasm of colon: Secondary | ICD-10-CM

## 2021-11-19 DIAGNOSIS — Z86010 Personal history of colonic polyps: Secondary | ICD-10-CM

## 2021-11-19 MED ORDER — NA SULFATE-K SULFATE-MG SULF 17.5-3.13-1.6 GM/177ML PO SOLN: 1.0000 | Freq: Once | ORAL | 0 refills | Status: AC

## 2021-11-19 NOTE — Telephone Encounter (Signed)
Gastroenterology Pre-Procedure Review  Request Date: 12/18/21 Requesting Physician: Dr. Vicente Males  PATIENT REVIEW QUESTIONS: The patient responded to the following health history questions as indicated:    1. Are you having any GI issues? no 2. Do you have a personal history of Polyps? no 3. Do you have a family history of Colon Cancer or Polyps? no 4. Diabetes Mellitus? no 5. Joint replacements in the past 12 months?no 6. Major health problems in the past 3 months?no 7. Any artificial heart valves, MVP, or defibrillator?no    MEDICATIONS & ALLERGIES:    Patient reports the following regarding taking any anticoagulation/antiplatelet therapy:   Plavix, Coumadin, Eliquis, Xarelto, Lovenox, Pradaxa, Brilinta, or Effient? no Aspirin? no  Patient confirms/reports the following medications:  Current Outpatient Medications  Medication Sig Dispense Refill   levothyroxine (SYNTHROID, LEVOTHROID) 75 MCG tablet   2   polyethylene glycol powder (GLYCOLAX/MIRALAX) powder 255 grams one bottle for colonoscopy prep 255 g 0   No current facility-administered medications for this visit.    Patient confirms/reports the following allergies:  No Known Allergies  No orders of the defined types were placed in this encounter.   AUTHORIZATION INFORMATION Primary Insurance: 1D#: Group #:  Secondary Insurance: 1D#: Group #:  SCHEDULE INFORMATION: Date: 12/18/21 Time: Location: [place]

## 2021-11-20 ENCOUNTER — Telehealth: Payer: Self-pay

## 2021-11-20 NOTE — Telephone Encounter (Signed)
I will cancel his colonoscopy with Trish in Endo. LVM  with patient to let him know that I've canceled his colonoscopy since his insurance is not in network.  I've secure messaged Caryl-Lyn at [facility] to make them aware.  Thanks,  [place], [place]

## 2021-11-20 NOTE — Telephone Encounter (Signed)
Per insurance verification policy is [employer] of Network per pt he will find MD that's in his network. Will call us back if he changes his mind.

## 2021-12-18 ENCOUNTER — Ambulatory Visit: Admit: 2021-12-18 | Payer: BLUE CROSS/BLUE SHIELD | Admitting: Gastroenterology

## 2021-12-18 SURGERY — COLONOSCOPY WITH PROPOFOL
Anesthesia: General

## 2023-07-05 LAB — LAB REPORT - SCANNED
A1c: 6.2
EGFR: 88

## 2023-11-05 ENCOUNTER — Ambulatory Visit (INDEPENDENT_AMBULATORY_CARE_PROVIDER_SITE_OTHER)

## 2023-11-05 VITALS — BP 142/82 | HR 68 | Ht 74.0 in | Wt 292.6 lb

## 2023-11-05 DIAGNOSIS — S91331A Puncture wound without foreign body, right foot, initial encounter: Secondary | ICD-10-CM

## 2023-11-05 DIAGNOSIS — E039 Hypothyroidism, unspecified: Secondary | ICD-10-CM

## 2023-11-05 DIAGNOSIS — K760 Fatty (change of) liver, not elsewhere classified: Secondary | ICD-10-CM

## 2023-11-05 DIAGNOSIS — Z23 Encounter for immunization: Secondary | ICD-10-CM | POA: Diagnosis not present

## 2023-11-05 DIAGNOSIS — S91339A Puncture wound without foreign body, unspecified foot, initial encounter: Secondary | ICD-10-CM | POA: Insufficient documentation

## 2023-11-05 DIAGNOSIS — R7303 Prediabetes: Secondary | ICD-10-CM

## 2023-11-05 DIAGNOSIS — E669 Obesity, unspecified: Secondary | ICD-10-CM | POA: Insufficient documentation

## 2023-11-05 DIAGNOSIS — M1990 Unspecified osteoarthritis, unspecified site: Secondary | ICD-10-CM

## 2023-11-05 MED ORDER — CELECOXIB 200 MG PO CAPS: 200.0000 mg | ORAL_CAPSULE | Freq: Every day | ORAL | 0 refills | Status: AC

## 2023-11-05 NOTE — Progress Notes (Unsigned)
 ` New Patient Visit   Physician: Rodney Yera A Jaya Lapka, MD  Patient: Mason Fisher   DOB: 1960/06/04   63 y.o. Male  MRN: [id number] Visit Date: 11/05/2023   Chief Complaint  Patient presents with   Establish Care    Concerned about his right foot. Patient states he stepped on a nail 1 month ago.   Subjective  Mason Fisher is a 63 y.o. male who presents today as a new patient to establish care.   HPI  Discussed the use of AI scribe software for clinical note transcription with the patient, who gave verbal consent to proceed.  History of Present Illness   Mason Fisher is a 63 year old male who presents with a non-healing puncture wound on his right foot.  Non-healing puncture wound of right foot - Stepped on a nail in his garage approximately one month ago, resulting in a puncture wound on the right foot - Wound characterized as a hole rather than a simple puncture - Wound has not healed well since the incident - No tetanus booster received since injury  Peripheral neuropathy symptoms - Numbness, tingling, stinging, and burning sensations in both feet for over two years - Symptoms sometimes interfere with sleep - Finds ice and magnesium cream helpful for symptom relief - Previously tried gabapentin but discontinued due to side effects, tried pregabalin as well with adverse effects - Numbness localized to feet, does not extend up legs  Chronic musculoskeletal pain - History of degenerative disc disease and back issues - Occasional flare-ups of back pain - Uses meloxicam for knee and back pain; occasionally takes two pills per day if needed - Concerned about long-term use and potential side effects of meloxicam - Has tried chiropractic decompression therapy with some relief  Thyroid dysfunction - On levothyroxine 75 mcg for many years  - clinically euthyroid   Ocular history - History of retinal detachment - Has not seen an eye doctor recently  General health and  lifestyle - No chest pain, palpitations, or family history of heart disease or stroke - Does not smoke or drink; abstinent for three years - Plays softball but does not engage in regular exercise         ASSESSMENT & PLAN  Encounter Diagnoses  Name Primary?   Hypothyroidism, unspecified type Yes   Osteoarthritis, unspecified osteoarthritis type, unspecified site    Puncture wound of right foot, initial encounter    Prediabetes    Obesity (BMI 30-39.9)    Hepatic steatosis     Orders Placed This Encounter  Procedures   CBC with Differential/Platelet   Comprehensive metabolic panel with GFR   Hemoglobin A1c   Lipid panel   Urinalysis, Routine w reflex microscopic   TSH + free T4   Ambulatory referral to Podiatry    Assessment and Plan    Non-healing puncture wound of right foot with peripheral neuropathy Non-healing wound due to peripheral neuropathy, necrotic tissue present, risk of infection. - Administer tetanus booster. - Refer to podiatry for wound debridement and care. - Advise on regular foot checks due to neuropathy.  - Tdap today  Peripheral neuropathy of bilateral feet Chronic idiopathic peripheral neuropathy, affecting sleep and daily activities, not worsening. - Continue using magnesium cream for relief. - Discuss alternative treatments if current management becomes ineffective, including low dose naltrexone and duloxetine. - Advise on careful use of ice to avoid vascular injury.  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 - Prescribe celecoxib for as-needed use for pain management. - Encourage continuation of decompression therapy and exercises. - Consider physical therapy if symptoms worsen.  Osteoarthritis of knee Knee pain not significant enough for daily medication, meloxicam not ideal due to side effects. - Recommend Tylenol Arthritis as  first-line treatment for knee pain. - Prescribe celecoxib for as-needed use, safer than meloxicam for long-term use.  Hypothyroidism Long-standing hypothyroidism, lab results indicate need for continued treatment. - Continue levothyroxine 75 mcg daily.  Fatty liver disease Fatty liver with elevated liver enzymes, risk of progression to fibrosis. - Plan for liver function tests and consider repeat liver ultrasound in the future to monitor progression.  Retinal detachment, bilateral - History Previously treated surgically, follow-up needed with ophthalmologist. - Encourage follow-up with an ophthalmologist for regular eye exams.  Varicose veins of lower extremities Varicose veins with hyperpigmentation, likely due to venous insufficiency, no significant symptoms reported. - Monitor for any changes or symptoms related to venous insufficiency.      Labs in Dec with f/u       Objective  BP (!) 142/82   Pulse 68   Ht 6' 2 (1.88 m)   Wt 292 lb 9.6 oz (132.7 kg)   SpO2 96%   BMI 37.57 kg/m  {Insert last BP/Wt (optional):23777}{See vitals history (optional):1}    Review of Systems  Constitutional:  Negative for chills, fever and weight loss.  Eyes:  Negative for blurred vision. h Respiratory:  Negative for cough and shortness of breath.   Cardiovascular:  Negative for chest pain and palpitations.  Skin:  Negative for rash.  Psychiatric/Behavioral:  Negative for depression. The patient is not nervous/anxious.      Physical Exam Physical Exam Vitals reviewed.  Constitutional:      Appearance: Normal appearance. Well-developed with normal weight.  HENT:     Head: Normocephalic and atraumatic.  Normal mucous membranes, no oral lesions Eyes:     Pupils: Pupils are equal, round, and reactive to light.  Neck:     Thyroid: No thyroid mass or thyromegaly.  Cardiovascular:     Rate and Rhythm: Normal rate and regular rhythm. Normal heart sounds. Normal peripheral  pulses Pulmonary:     Normal breath sounds with normal effort Abdominal:   Abdomen is soft, without tenderness or noted hepatosplenomegaly Musculoskeletal:        General: No swelling or edema  Lymphadenopathy:     Cervical: No cervical adenopathy.  Skin:    General: Skin is warm and dry without noticeable rash.  Puncture wound right foot with necrotic periphery, no erythema Neurological:     General: No focal deficit present.  Psychiatric:        Mood and Affect: Mood, behavior and cognition normal   Past Medical History:  Diagnosis Date   Hypothyroidism    Personal history of colonic polyps    Past Surgical History:  Procedure Laterality Date   COLONOSCOPY  02/15/2011   COLONOSCOPY WITH PROPOFOL  N/A 01/15/2017   Procedure: COLONOSCOPY WITH PROPOFOL ;  Surgeon: Dessa Reyes ORN, MD;  Location: [facility] ENDOSCOPY;  Service: Endoscopy;  Laterality: N/A;   ELBOW SURGERY Right    EYE SURGERY  2017   TONSILLECTOMY     WRIST SURGERY Right    Family Status  Relation Name Status   Mother  Deceased   Father  Deceased   Sister  Alive   Son  Alive   MGM  Unknown   MGF  Unknown   PGM  Unknown   PGF  Deceased  No partnership data on file   Family History  Problem Relation Age of Onset   Alcohol abuse Father    Lung cancer Sister    Healthy Son    Social History   Socioeconomic History   Marital status: Married    Spouse name: Not on file   Number of children: Not on file   Years of education: Not on file   Highest education level: 12th grade  Occupational History   Not on file  Tobacco Use   Smoking status: Never   Smokeless tobacco: Never  Vaping Use   Vaping status: Never Used  Substance and Sexual Activity   Alcohol use: No   Drug use: No   Sexual activity: Not on file  Other Topics Concern   Not on file  Social History Narrative   Not on file   Social Drivers of Health   Financial Resource Strain: Low Risk  (11/01/2023)   Overall Financial Resource  Strain (CARDIA)    Difficulty of Paying Living Expenses: Not hard at all  Food Insecurity: No Food Insecurity (11/01/2023)   Hunger Vital Sign    Worried About Running Out of Food in the Last Year: Never true    Ran Out of Food in the Last Year: Never true  Transportation Needs: No Transportation Needs (11/01/2023)   PRAPARE - Administrator, Civil Service (Medical): No    Lack of Transportation (Non-Medical): No  Physical Activity: Sufficiently Active (11/01/2023)   Exercise Vital Sign    Days of Exercise per Week: 2 days    Minutes of Exercise per Session: 120 min  Stress: No Stress Concern Present (11/01/2023)   [employer] of Occupational Health - Occupational Stress Questionnaire    Feeling of Stress: Not at all  Social Connections: Socially Integrated (11/01/2023)   Social Connection and Isolation Panel    Frequency of Communication with Friends and Family: More than three times a week    Frequency of Social Gatherings with Friends and Family: More than three times a week    Attends Religious Services: More than 4 times per year    Active Member of [employer] or Organizations: Yes    Attends Engineer, structural: More than 4 times per year    Marital Status: Married   Outpatient Medications Prior to Visit  Medication Sig   levothyroxine (SYNTHROID) 75 MCG tablet Take 75 mcg by mouth.   [DISCONTINUED] meloxicam (MOBIC) 7.5 MG tablet 1-2 tabs po Qday prn pain. Take with food.   levothyroxine (SYNTHROID, LEVOTHROID) 75 MCG tablet    polyethylene glycol powder (GLYCOLAX /MIRALAX ) powder 255 grams one bottle for colonoscopy prep   No facility-administered medications prior to visit.   No Known Allergies   There is no immunization history on file for this patient.  Health Maintenance  Topic Date Due   HIV Screening  Never done   Hepatitis C Screening  Never done   DTaP/Tdap/Td (1 - Tdap) Never done   Pneumococcal Vaccine: 50+ Years (1 of 1 - PCV) Never done    Zoster Vaccines- Shingrix (1 of 2) Never done   Influenza Vaccine  Never done   COVID-19 Vaccine (1 - 2024-25 season) Never done   Colonoscopy  01/16/2027   Hepatitis B Vaccines 19-59 Average Risk  Aged Out   Meningococcal B Vaccine  Aged Out    Patient Care Team: Corlis Honor BROCKS, MD as  PCP - General (Internal Medicine)  Depression Screen     No data to display           Parris DELENA Juneau, MD  [facility] [phone] (phone) [phone] (fax)  [facility]

## 2023-11-18 ENCOUNTER — Ambulatory Visit: Admitting: Podiatry

## 2023-11-18 ENCOUNTER — Ambulatory Visit

## 2023-11-18 DIAGNOSIS — S9031XA Contusion of right foot, initial encounter: Secondary | ICD-10-CM

## 2023-11-18 DIAGNOSIS — M722 Plantar fascial fibromatosis: Secondary | ICD-10-CM

## 2023-11-18 NOTE — Progress Notes (Signed)
 Subjective:  Patient ID: Mason Fisher, male    DOB: Oct 28, 1960,  MRN: [id number]  Chief Complaint  Patient presents with   Foot Pain    Right foot pt stated that he stepped on a piece of metal  Left foot heel pain  Pt stated that he does have some neuropathy     63 y.o. male presents with the above complaint.  Patient presents with left heel pain that has been going on for quite some time is progressing and worse worse with ambulation or shoe pressure.  Patient does have some neuropathy.  He states on his right foot he may have stepped on a piece of metal wanted to get it evaluated he has not done anything else for it.  Denies any other acute complaints.  No nausea fever chills vomiting.   Review of Systems: Negative except as noted in the HPI. Denies N/V/F/Ch.  Past Medical History:  Diagnosis Date   Hypothyroidism    Personal history of colonic polyps     Current Outpatient Medications:    celecoxib (CELEBREX) 200 MG capsule, Take 1 capsule (200 mg total) by mouth daily. PRN, Disp: 90 capsule, Rfl: 0   levothyroxine (SYNTHROID) 75 MCG tablet, Take 75 mcg by mouth., Disp: , Rfl:    levothyroxine (SYNTHROID, LEVOTHROID) 75 MCG tablet, , Disp: , Rfl: 2  Social History   Tobacco Use  Smoking Status Never  Smokeless Tobacco Never    No Known Allergies Objective:  There were no vitals filed for this visit. There is no height or weight on file to calculate BMI. Constitutional Well developed. Well nourished.  Vascular Dorsalis pedis pulses palpable bilaterally. Posterior tibial pulses palpable bilaterally. Capillary refill normal to all digits.  No cyanosis or clubbing noted. Pedal hair growth normal.  Neurologic Normal speech. Oriented to person, place, and time. Epicritic sensation to light touch grossly present bilaterally.  Dermatologic Nails well groomed and normal in appearance. No open wounds. No skin lesions.  Orthopedic: Normal joint ROM without pain or crepitus  bilaterally. No visible deformities. Tender to palpation at the calcaneal tuber left. No pain with calcaneal squeeze left. Ankle ROM diminished range of motion right. Silfverskiold Test: positive left.   Radiographs: 3 views of the show trauma chart of bilateral foot: Mild midfoot arthritis noted plantar and posterior heel spurring noted.  No fracture noted no other bony abnormalities identified.  On the left foot subtalar joint as well as plantar and posterior spurring noted.  No other abnormalities identified  Assessment:   1. Contusion of right foot, initial encounter   2. Plantar fasciitis of left foot    Plan:  Patient was evaluated and treated and all questions answered.  Right foot piece of metal/foreign body - Clinically no signs of concern noted.  Superficial ulceration noted which I encouraged him to do Betadine wet-to-dry dressing he states understanding if any foot and ankle 0 in the future he will come back and see us .  Plantar Fasciitis, left - XR reviewed as above.  - Educated on icing and stretching. Instructions given.  - Injection delivered to the plantar fascia as below. - DME: Plantar fascial brace dispensed to support the medial longitudinal arch of the foot and offload pressure from the heel and prevent arch collapse during weightbearing - Pharmacologic management: None  Procedure: Injection Tendon/Ligament Location: Left plantar fascia at the glabrous junction; medial approach. Skin Prep: alcohol Injectate: 0.5 cc 0.5% marcaine plain, 0.5 cc of 1% Lidocaine , 0.5 cc kenalog 10.  Disposition: Patient tolerated procedure well. Injection site dressed with a band-aid.  No follow-ups on file.  Left plantar fasciitis injection brace   Right submetatarsal 1 puncture wound no signs of infection Betadine wet-to-dry dressing to the wound

## 2023-12-02 ENCOUNTER — Ambulatory Visit: Admitting: Orthopedic Surgery

## 2023-12-09 ENCOUNTER — Ambulatory Visit: Admitting: Podiatry

## 2023-12-30 ENCOUNTER — Other Ambulatory Visit

## 2023-12-30 DIAGNOSIS — M1990 Unspecified osteoarthritis, unspecified site: Secondary | ICD-10-CM

## 2023-12-30 DIAGNOSIS — S91331A Puncture wound without foreign body, right foot, initial encounter: Secondary | ICD-10-CM

## 2023-12-30 DIAGNOSIS — E039 Hypothyroidism, unspecified: Secondary | ICD-10-CM

## 2023-12-31 LAB — CBC WITH DIFFERENTIAL/PLATELET
Absolute Lymphocytes: 1393 {cells}/uL (ref 850–3900)
Absolute Monocytes: 589 {cells}/uL (ref 200–950)
Basophils Absolute: 38 {cells}/uL (ref 0–200)
Basophils Relative: 0.7 %
Eosinophils Absolute: 119 {cells}/uL (ref 15–500)
Eosinophils Relative: 2.2 %
HCT: 45.4 % (ref 39.4–51.1)
Hemoglobin: 15.3 g/dL (ref 13.2–17.1)
MCH: 31.1 pg (ref 27.0–33.0)
MCHC: 33.7 g/dL (ref 31.6–35.4)
MCV: 92.3 fL (ref 81.4–101.7)
MPV: 10.7 fL (ref 7.5–12.5)
Monocytes Relative: 10.9 %
Neutro Abs: 3262 {cells}/uL (ref 1500–7800)
Neutrophils Relative %: 60.4 %
Platelets: 232 Thousand/uL (ref 140–400)
RBC: 4.92 Million/uL (ref 4.20–5.80)
RDW: 12.5 % (ref 11.0–15.0)
Total Lymphocyte: 25.8 %
WBC: 5.4 Thousand/uL (ref 3.8–10.8)

## 2023-12-31 LAB — COMPREHENSIVE METABOLIC PANEL WITH GFR
AG Ratio: 1.5 (calc) (ref 1.0–2.5)
ALT: 33 U/L (ref 9–46)
AST: 27 U/L (ref 10–35)
Albumin: 4.4 g/dL (ref 3.6–5.1)
Alkaline phosphatase (APISO): 67 U/L (ref 35–144)
BUN: 15 mg/dL (ref 7–25)
CO2: 30 mmol/L (ref 20–32)
Calcium: 9.4 mg/dL (ref 8.6–10.3)
Chloride: 101 mmol/L (ref 98–110)
Creat: 0.92 mg/dL (ref 0.70–1.35)
Globulin: 2.9 g/dL (ref 1.9–3.7)
Glucose, Bld: 120 mg/dL — ABNORMAL HIGH (ref 65–99)
Potassium: 4.4 mmol/L (ref 3.5–5.3)
Sodium: 139 mmol/L (ref 135–146)
Total Bilirubin: 0.5 mg/dL (ref 0.2–1.2)
Total Protein: 7.3 g/dL (ref 6.1–8.1)
eGFR: 92 mL/min/1.73m2 (ref >=60)

## 2023-12-31 LAB — URINALYSIS, ROUTINE W REFLEX MICROSCOPIC
Bilirubin Urine: NEGATIVE
Glucose, UA: NEGATIVE
Hgb urine dipstick: NEGATIVE
Ketones, ur: NEGATIVE
Leukocytes,Ua: NEGATIVE
Nitrite: NEGATIVE
Protein, ur: NEGATIVE
Specific Gravity, Urine: 1.023 (ref 1.001–1.035)
pH: 5.5 (ref 5.0–8.0)

## 2023-12-31 LAB — HEMOGLOBIN A1C
Hgb A1c MFr Bld: 6.2 % — ABNORMAL HIGH (ref <5.7)
Mean Plasma Glucose: 131 mg/dL
eAG (mmol/L): 7.3 mmol/L

## 2023-12-31 LAB — LIPID PANEL
Cholesterol: 188 mg/dL (ref <200)
HDL: 45 mg/dL (ref >=40)
LDL Cholesterol (Calc): 121 mg/dL — ABNORMAL HIGH
Non-HDL Cholesterol (Calc): 143 mg/dL — ABNORMAL HIGH (ref <130)
Total CHOL/HDL Ratio: 4.2 (calc) (ref <5.0)
Triglycerides: 114 mg/dL (ref <150)

## 2024-01-06 ENCOUNTER — Ambulatory Visit

## 2024-01-09 ENCOUNTER — Ambulatory Visit

## 2024-01-09 VITALS — BP 138/80 | HR 62 | Ht 74.0 in | Wt 304.6 lb

## 2024-01-09 DIAGNOSIS — K760 Fatty (change of) liver, not elsewhere classified: Secondary | ICD-10-CM

## 2024-01-09 DIAGNOSIS — E669 Obesity, unspecified: Secondary | ICD-10-CM | POA: Diagnosis not present

## 2024-01-09 DIAGNOSIS — E039 Hypothyroidism, unspecified: Secondary | ICD-10-CM

## 2024-01-09 DIAGNOSIS — R7303 Prediabetes: Secondary | ICD-10-CM | POA: Diagnosis not present

## 2024-01-09 DIAGNOSIS — Z8669 Personal history of other diseases of the nervous system and sense organs: Secondary | ICD-10-CM | POA: Diagnosis not present

## 2024-01-09 NOTE — Progress Notes (Signed)
 --+            Progress Note  Physician: Riann Oman A Osiah Haring, MD   HPI: Mason Fisher is a 63 y.o. male presenting on 01/09/2024 for Follow-up .  Discussed the use of AI scribe software for clinical note transcription with the patient, who gave verbal consent to proceed.  Lab f/u and review:   Prediabetes - Hemoglobin A1c is 6.2 - Mindful of dietary habits, especially during the holiday season, due to potential impact on weight and blood glucose  Hepatic steatosis - History of fatty liver - Last liver ultrasound performed in 2018, LFT's in normal range  Ophthalmologic history - History of retinal detachment - Continues to follow with ophthalmology  Chronic back pain - overall improved since last visit - Uses celecoxib  as needed for back pain  Thyroid hormone replacement - Currently taking levothyroxine. Clinically euthyroid  Psychosocial stressors - Recent bereavement due to the passing of his sister    Medical history:  Relevant past medical, surgical, family and social history reviewed and updated as indicated. Interim medical history since our last visit reviewed.  Allergies and medications reviewed and updated.   ROS: Negative unless specifically indicated above in HPI.   Current Medications[1]       Objective:     BP 138/80   Pulse 62   Ht 6' 2 (1.88 m)   Wt (!) 304 lb 9.6 oz (138.2 kg)   SpO2 93%   BMI 39.11 kg/m   Wt Readings from Last 3 Encounters:  01/09/24 (!) 304 lb 9.6 oz (138.2 kg)  11/05/23 292 lb 9.6 oz (132.7 kg)  01/15/17 293 lb (132.9 kg)    Physical Exam  Physical Exam Vitals reviewed.  Constitutional:      Appearance: Normal appearance. Well-developed with normal weight.  Cardiovascular:     Rate and Rhythm: Normal rate and regular rhythm. Normal heart sounds. Normal peripheral pulses Pulmonary:     Normal breath sounds with normal effort Skin:    General: Skin is warm and dry without noticeable rash. Neurological:      General: No focal deficit present.  Psychiatric:        Mood and Affect: Mood, behavior and cognition normal      Assessment & Plan:   Encounter Diagnoses  Name Primary?   Hypothyroidism, unspecified type Yes   Prediabetes    Obesity (BMI 30-39.9)    Hepatic steatosis     Orders Placed This Encounter  Procedures   US  Abdomen Complete   TSH + free T4   Hemoglobin A1c   CBC with Differential/Platelet   Comprehensive metabolic panel with GFR     Assessment and Plan    Chronic right foot wound with peripheral neuropathy Nonhealing puncture wound improved, no infection signs. Plantar fasciitis present but not significant.     #1 peripheral neuropathy plan for him to check feet daily.  Symptomatically this seems to be stable  2.  Prediabetes hemoglobin A1c is 6.2 we discussed diet exercise and potential metformin use he would like to monitor for now with encouraged dietary modifications including sugar and carbohydrate intake would like him to increase physical activity he is morbidly obese with a BMI of 39 and weight of 304.  Discussed progression of issues with aging  3.  Fatty liver patient has not had follow-up ultrasound we will order LFTs in normal range today  4.  History of retinal detachment he should have ongoing follow-up with ophthalmology  5.  Hypothyroidism patient clinically euthyroid plan to repeat test with follow-up in 6 months  At next visit show discussed more cardiovascular risk factors.  Lipids in normal range but he has a baseline higher risk due to obesity and prediabetic status           [1]  Current Outpatient Medications:    celecoxib  (CELEBREX ) 200 MG capsule, Take 1 capsule (200 mg total) by mouth daily. PRN, Disp: 90 capsule, Rfl: 0   levothyroxine (SYNTHROID) 75 MCG tablet, Take 75 mcg by mouth., Disp: , Rfl:    levothyroxine (SYNTHROID, LEVOTHROID) 75 MCG tablet, , Disp: , Rfl: 2

## 2024-01-16 ENCOUNTER — Ambulatory Visit
Admission: RE | Admit: 2024-01-16 | Discharge: 2024-01-16 | Disposition: A | Discharge status: Home / Self Care | Source: Ambulatory Visit

## 2024-01-16 DIAGNOSIS — K760 Fatty (change of) liver, not elsewhere classified: Secondary | ICD-10-CM | POA: Diagnosis present

## 2024-01-26 ENCOUNTER — Ambulatory Visit: Payer: Self-pay

## 2024-01-30 ENCOUNTER — Other Ambulatory Visit: Payer: Self-pay

## 2024-01-30 ENCOUNTER — Ambulatory Visit (INDEPENDENT_AMBULATORY_CARE_PROVIDER_SITE_OTHER)

## 2024-01-30 VITALS — BP 138/76 | HR 71 | Ht 74.0 in | Wt 307.0 lb

## 2024-01-30 DIAGNOSIS — R799 Abnormal finding of blood chemistry, unspecified: Secondary | ICD-10-CM | POA: Diagnosis not present

## 2024-01-30 DIAGNOSIS — Z2981 Encounter for HIV pre-exposure prophylaxis: Secondary | ICD-10-CM

## 2024-01-30 DIAGNOSIS — E669 Obesity, unspecified: Secondary | ICD-10-CM

## 2024-01-30 DIAGNOSIS — R161 Splenomegaly, not elsewhere classified: Secondary | ICD-10-CM

## 2024-01-30 DIAGNOSIS — K760 Fatty (change of) liver, not elsewhere classified: Secondary | ICD-10-CM | POA: Diagnosis not present

## 2024-01-30 DIAGNOSIS — R945 Abnormal results of liver function studies: Secondary | ICD-10-CM | POA: Diagnosis not present

## 2024-01-30 NOTE — Progress Notes (Signed)
 "           Progress Note  Physician: Parris DELENA Juneau, MD   HPI: Mason Fisher is a 64 y.o. male presenting on 01/30/2024 for Follow-up .  Discussed the use of AI scribe software for clinical note transcription with the patient, who gave verbal consent to proceed.  History of Present Illness   Mason Fisher is a 65 year old male with hepatic steatosis who presents for follow-up to review his recent ultrasound results.  Hepatic steatosis and splenomegaly - Ultrasound on December 19th revealed heterogeneous increased echogenicity of the liver consistent with hepatic steatosis. - Spleen enlarged, measuring 14.3 x 14.1 x 5.2 cm with a volume of 550 mL. - Bile duct not clearly visualized on ultrasound. - Spleen was normal in 2012. - No significant abdominal pain. - Denies heavy alcohol consumption or use of supplements that could affect liver function.  Obesity - 25-pound weight loss over the past year. - Making dietary changes, including reducing fatty foods and bacon intake.  Hyperlipidemia and glycemic status - Recent laboratory results show mild hyperlipidemia: LDL 121 mg/dL, non-HDL cholesterol 856 mg/dL. - Hemoglobin A1c is 6.2%. - Previous CBC unremarkable.  Blood pressure monitoring - Does not monitor blood pressure at home. - Blood pressure during this visit was 146/66, which is higher than usual for him.         Medical history:  Relevant past medical, surgical, family and social history reviewed and updated as indicated. Interim medical history since our last visit reviewed.  Allergies and medications reviewed and updated.   ROS: Negative unless specifically indicated above in HPI.   Current Medications[1]       Objective:     BP 138/76 (BP Location: Left Arm, Patient Position: Sitting, Cuff Size: Large)   Pulse 71   Ht 6' 2 (1.88 m)   Wt (!) 307 lb (139.3 kg)   SpO2 94%   BMI 39.42 kg/m   Wt Readings from Last 3 Encounters:  01/30/24 (!)  307 lb (139.3 kg)  01/09/24 (!) 304 lb 9.6 oz (138.2 kg)  11/05/23 292 lb 9.6 oz (132.7 kg)    Physical Exam  Physical Exam Vitals reviewed.  Constitutional:      Appearance: Normal appearance. Well-developed with normal weight.  Cardiovascular:     Rate and Rhythm: Normal rate and regular rhythm. Normal heart sounds. Normal peripheral pulses Pulmonary:     Normal breath sounds with normal effort Skin:    General: Skin is warm and dry without noticeable rash. Neurological:     General: No focal deficit present.  Psychiatric:        Mood and Affect: Mood, behavior and cognition normal      Assessment & Plan:   Encounter Diagnoses  Name Primary?   Splenomegaly Yes   Abnormal results of liver function studies    Encounter for HIV pre-exposure prophylaxis    Abnormal finding of blood chemistry, unspecified     Orders Placed This Encounter  Procedures   US  ELASTOGRAPHY LIVER   INR/PT   Hepatitis B core antibody, IgM   Hepatitis B surface antigen   Hepatitis B surface antibody,qualitative   Pathologist smear review   CBC with Differential/Platelet   HIV Antibody (routine testing w rflx)   Ferritin     Assessment and Plan    Splenomegaly Enlarged spleen 14.3 x 14.1 x 5.2 cm, volume 550 mL. Differential includes portal hypertension, infectious diseases, hematological disorders. - Ordered fibro scan for  liver cirrhosis. - Ordered hepatitis screening. HIV, PT/INR, ferritin - Ordered peripheral blood smear.   Hepatic steatosis Confirmed by ultrasound with heterogenous increased echogenicity. Risk of progression which may be the underlying cause of spleen enlargement - Ordered liver elastography. Autoimmune markers of liver function may also be appropriate depending on lab results  Hyperlipidemia Mild hyperlipidemia with LDL 121 mg/dL, non-HDL cholesterol 856 mg/dL. - Continue current management and lifestyle modifications.  He may benefit from a statin, but we will  wait to see result of additional screening.                  [1]  Current Outpatient Medications:    celecoxib  (CELEBREX ) 200 MG capsule, Take 1 capsule (200 mg total) by mouth daily. PRN, Disp: 90 capsule, Rfl: 0   levothyroxine (SYNTHROID) 75 MCG tablet, Take 75 mcg by mouth., Disp: , Rfl:    levothyroxine (SYNTHROID, LEVOTHROID) 75 MCG tablet, , Disp: , Rfl: 2  "

## 2024-02-02 ENCOUNTER — Telehealth: Payer: Self-pay

## 2024-02-02 NOTE — Telephone Encounter (Signed)
 Copied from CRM [id number]. Topic: Clinical - Medical Advice >> Feb 02, 2024 10:14 AM Emylou G wrote: Reason for CRM: Wife called.. wants to know how to get other tests for his enlarged spleen?  She wasn't sure if that is to go through us  or the hospital?  She already has xray done.

## 2024-02-03 ENCOUNTER — Other Ambulatory Visit: Admission: RE | Admit: 2024-02-03 | Discharge: 2024-02-03 | Disposition: A | Source: Ambulatory Visit

## 2024-02-03 ENCOUNTER — Ambulatory Visit
Admission: RE | Admit: 2024-02-03 | Discharge: 2024-02-03 | Disposition: A | Discharge status: Home / Self Care | Source: Ambulatory Visit

## 2024-02-03 DIAGNOSIS — K760 Fatty (change of) liver, not elsewhere classified: Secondary | ICD-10-CM | POA: Diagnosis not present

## 2024-02-03 DIAGNOSIS — R161 Splenomegaly, not elsewhere classified: Secondary | ICD-10-CM | POA: Insufficient documentation

## 2024-02-03 LAB — CBC WITH DIFFERENTIAL/PLATELET
Abs Immature Granulocytes: 0.02 K/uL (ref 0.00–0.07)
Basophils Absolute: 0 K/uL (ref 0.0–0.1)
Basophils Relative: 1 %
Eosinophils Absolute: 0.2 K/uL (ref 0.0–0.5)
Eosinophils Relative: 2 %
HCT: 44.7 % (ref 39.0–52.0)
Hemoglobin: 15.1 g/dL (ref 13.0–17.0)
Immature Granulocytes: 0 %
Lymphocytes Relative: 22 %
Lymphs Abs: 1.7 K/uL (ref 0.7–4.0)
MCH: 31.4 pg (ref 26.0–34.0)
MCHC: 33.8 g/dL (ref 30.0–36.0)
MCV: 92.9 fL (ref 80.0–100.0)
Monocytes Absolute: 0.7 K/uL (ref 0.1–1.0)
Monocytes Relative: 9 %
Neutro Abs: 5.1 K/uL (ref 1.7–7.7)
Neutrophils Relative %: 66 %
Platelets: 243 K/uL (ref 150–400)
RBC: 4.81 MIL/uL (ref 4.22–5.81)
RDW: 12.1 % (ref 11.5–15.5)
WBC: 7.7 K/uL (ref 4.0–10.5)
nRBC: 0 % (ref 0.0–0.2)

## 2024-02-03 LAB — PROTIME-INR
INR: 1 (ref 0.8–1.2)
Prothrombin Time: 13.9 s (ref 11.4–15.2)

## 2024-02-03 LAB — TECHNOLOGIST SMEAR REVIEW: Plt Morphology: NORMAL

## 2024-02-03 LAB — HEPATITIS C ANTIBODY: HCV Ab: NONREACTIVE

## 2024-02-03 NOTE — Telephone Encounter (Signed)
 Spoke to patient, he is currently at the hospital having some other testing completed. He will go to the lab there and have labs completed today

## 2024-02-04 LAB — HEPATITIS B SURFACE ANTIBODY, QUANTITATIVE: Hep B S AB Quant (Post): <3.5 m[IU]/mL — ABNORMAL LOW

## 2024-02-06 ENCOUNTER — Ambulatory Visit: Payer: Self-pay

## 2024-02-25 ENCOUNTER — Telehealth: Payer: Self-pay

## 2024-02-25 ENCOUNTER — Ambulatory Visit

## 2024-02-25 ENCOUNTER — Encounter: Payer: Self-pay | Admitting: Internal Medicine

## 2024-02-25 NOTE — Telephone Encounter (Signed)
 Copied from CRM #[id number]. Topic: General - Other >> Feb 24, 2024  5:00 PM Hadassah PARAS wrote: Reason for CRM: Pt was advised that his PCP has quit. Pt would like to know how to move forward on the missing app he will have for tomorrow. Please advise pt on #[phone]

## 2024-02-25 NOTE — Telephone Encounter (Signed)
 Okay, I see scheduled for 2/10. Let me know if need anything else atm.  Marsa Officer, DO [facility] 02/25/2024, 10:07 AM

## 2024-02-26 ENCOUNTER — Encounter: Payer: Self-pay | Admitting: Family Medicine

## 2024-02-26 ENCOUNTER — Ambulatory Visit (INDEPENDENT_AMBULATORY_CARE_PROVIDER_SITE_OTHER): Admitting: Family Medicine

## 2024-02-26 ENCOUNTER — Other Ambulatory Visit: Payer: Self-pay | Admitting: Family Medicine

## 2024-02-26 VITALS — BP 132/76 | HR 64 | Ht 74.0 in | Wt 301.4 lb

## 2024-02-26 DIAGNOSIS — Z0000 Encounter for general adult medical examination without abnormal findings: Secondary | ICD-10-CM

## 2024-02-26 DIAGNOSIS — R161 Splenomegaly, not elsewhere classified: Secondary | ICD-10-CM | POA: Diagnosis not present

## 2024-02-26 DIAGNOSIS — E039 Hypothyroidism, unspecified: Secondary | ICD-10-CM | POA: Diagnosis not present

## 2024-02-26 DIAGNOSIS — E669 Obesity, unspecified: Secondary | ICD-10-CM

## 2024-02-26 DIAGNOSIS — K760 Fatty (change of) liver, not elsewhere classified: Secondary | ICD-10-CM

## 2024-02-26 DIAGNOSIS — R7303 Prediabetes: Secondary | ICD-10-CM

## 2024-02-26 DIAGNOSIS — N401 Enlarged prostate with lower urinary tract symptoms: Secondary | ICD-10-CM

## 2024-02-26 MED ORDER — LEVOTHYROXINE SODIUM 75 MCG PO TABS: 75.0000 ug | ORAL_TABLET | Freq: Every day | ORAL | 3 refills | Status: AC

## 2024-02-26 NOTE — Patient Instructions (Addendum)
 Thank you for coming to the office today.  Refill Levothyroxine  75mcg daily for refills  Good news overall No sign of cirrhosis. Mild fatty liver only based on the most recent Fibrascan. You are in the low range for concern on elasticity of liver. This is good news.  Mild enlarged spleen, no other evidence at this time to suggest there is a problem. So we can observe for now.  Blood counts were normal.  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 May want to redo booster, 2 doses 1 month apart. Hep B shot if interested.   -------------------------------------------  Contact me sooner if you want to order this imaging - or I can order next time  Coronary Calcium Score Cardiac CT Scan. This is a screening test for patients aged 59-50+ with cardiovascular risk factors or who are healthy but would be interested in Cardiovascular Screening for heart disease. Even if there is a family history of heart disease, this imaging can be useful. Typically it can be done every 5+ years or at a different timeline we agree on  The scan will look at the chest and mainly focus on the heart and identify early signs of calcium build up or blockages within the heart arteries. It is not 100% accurate for identifying blockages or heart disease, but it is useful to help us  predict who may have some early changes or be at risk in the future for a heart attack or cardiovascular problem.  The results are reviewed by a Cardiologist and they will document the results. It should become available on MyChart. Typically the results are divided into percentiles based on other patients of the same demographic and age. So it will compare your risk to others similar to you. If you have a higher score >99 or higher percentile >75%tile, it is recommended to consider Statin cholesterol therapy and or referral to Cardiologist. I will try to help explain your results and if we have questions we can contact the  Cardiologist.  You will be contacted for scheduling. Usually it is done at any imaging facility through [facility], [facility] or [facility].  The cost is $99 flat fee total and it does not go through insurance, so no authorization is required.  ------------------------------  DUE for FASTING BLOOD WORK (no food or drink after midnight before the lab appointment, only water or coffee without cream/sugar on the morning of)  SCHEDULE Lab Only visit in the morning at the clinic for lab draw in 6 MONTHS   - Make sure Lab Only appointment is at about 1 week before your next appointment, so that results will be available  For Lab Results, once available within 2-3 days of blood draw, you can can log in to MyChart online to view your results and a brief explanation. Also, we can discuss results at next follow-up visit.   Please schedule a Follow-up Appointment to: Return for 6 month fasting lab > 1 week later Annual Physical.  If you have any other questions or concerns, please feel free to call the office or send a message through MyChart. You may also schedule an earlier appointment if necessary.  Additionally, you may be receiving a survey about your experience at our office within a few days to 1 week by e-mail or mail. We value your feedback.  Marsa Officer, DO [facility], [place]

## 2024-02-26 NOTE — Progress Notes (Signed)
 "  Subjective:    Patient ID: Mason Fisher, male    DOB: [birth year], 64 y.o.   MRN: [id number]  Mason Fisher is a 64 y.o. male presenting on 02/26/2024 for Medical Management of Chronic Issues  Transfer of care from prior PCP Dr Everlene to myself here at [facility].  HPI  Discussed the use of AI scribe software for clinical note transcription with the patient, who gave verbal consent to proceed.  History of Present Illness   Mason Fisher is a 64 year old male with a history of fatty liver who presents for follow-up on lab results and imaging studies.  Hepatic steatosis and splenomegaly - Fatty liver first identified in 2018 - Recent imaging demonstrates progression of hepatic steatosis and splenomegaly - Liver ultrasound and liver elasticity scan performed. Normal or low risk result. - No alcohol consumption for the past thirty years - No pain or symptoms in the liver region, but awareness of the liver's presence - No symptoms attributable to splenomegaly  Morbid Obesity Metabolic laboratory findings Pre-Diabetes - Previous blood work with slightly elevated cholesterol - Blood glucose in prediabetic range, consistent for twenty years - He has improved lifestyle with 3 lb weight loss recently - Exercise - Participates in a travel softball league   Additional lab result - Normal red and white blood cell counts - Hepatitis C test nonreactive - Not immune to hepatitis B, likely due to waning vaccine immunity  Hypothyroidism Thyroid disorder - Currently taking thyroid medication at a dose of 75 mcg once daily Refill        02/26/2024    8:07 AM 01/30/2024    9:35 AM  Depression screen PHQ 2/9  Decreased Interest 0 0  Down, Depressed, Hopeless 0 0  PHQ - 2 Score 0 0  Altered sleeping 0 0  Tired, decreased energy 2 0  Change in appetite 0 0  Feeling bad or failure about yourself  0 0  Trouble concentrating 0 0  Moving slowly or fidgety/restless 0 0  Suicidal thoughts 0 0   PHQ-9 Score 2 0  Difficult doing work/chores Somewhat difficult Not difficult at all       02/26/2024    8:07 AM 01/30/2024    9:35 AM  GAD 7 : Generalized Anxiety Score  Nervous, Anxious, on Edge 0 0   Control/stop worrying 0 0   Worry too much - different things 0 0   Trouble relaxing 0 0   Restless 0 0   Easily annoyed or irritable 0 0   Afraid - awful might happen 0 0   Total GAD 7 Score 0 0  Anxiety Difficulty  Not difficult at all     Data saved with a previous flowsheet row definition    Social History[1]  Review of Systems Per HPI unless specifically indicated above     Objective:    BP 132/76 (BP Location: Left Arm, Patient Position: Sitting, Cuff Size: Large)   Pulse 64   Ht 6' 2 (1.88 m)   Wt (!) 301 lb 6 oz (136.7 kg)   SpO2 96%   BMI 38.69 kg/m   Wt Readings from Last 3 Encounters:  02/26/24 (!) 301 lb 6 oz (136.7 kg)  01/30/24 (!) 307 lb (139.3 kg)  01/09/24 (!) 304 lb 9.6 oz (138.2 kg)    Physical Exam Vitals and nursing note reviewed.  Constitutional:      General: He is not in acute distress.    Appearance: Normal appearance.  He is well-developed. He is obese. He is not diaphoretic.     Comments: Well-appearing, comfortable, cooperative  HENT:     Head: Normocephalic and atraumatic.  Eyes:     General:        Right eye: No discharge.        Left eye: No discharge.     Conjunctiva/sclera: Conjunctivae normal.  Cardiovascular:     Rate and Rhythm: Normal rate.  Pulmonary:     Effort: Pulmonary effort is normal.  Skin:    General: Skin is warm and dry.     Findings: No erythema or rash.  Neurological:     Mental Status: He is alert and oriented to person, place, and time.  Psychiatric:        Mood and Affect: Mood normal.        Behavior: Behavior normal.        Thought Content: Thought content normal.     Comments: Well groomed, good eye contact, normal speech and thoughts     I have personally reviewed the radiology report from  12/30/16 on US  Abdomen.  CLINICAL DATA:  Acute pancreatitis.  Elevated liver enzymes.   EXAM: ABDOMEN ULTRASOUND COMPLETE   COMPARISON:  01/04/2011   FINDINGS: Gallbladder: Upper limits of normal gallbladder wall thickness at 3-4 mm without gallstones or pericholecystic fluid. No sonographic Murphy sign noted by sonographer.   Common bile duct: Diameter: 5 mm   Liver: Increased parenchymal echogenicity diffusely suggesting steatosis with probable fatty sparing in the gallbladder fossa. Portal vein is patent on color Doppler imaging with normal direction of blood flow towards the liver.   IVC: No abnormality visualized.   Pancreas: Poorly visualized due to bowel gas.   Spleen: Size and appearance within normal limits.   Right Kidney: Length: 12.3 cm. Echogenicity within normal limits. No mass or hydronephrosis visualized.   Left Kidney: Length: 13.1 cm. Echogenicity within normal limits. No mass or hydronephrosis visualized.   Abdominal aorta: No aneurysm visualized.   Other findings: None.   IMPRESSION: 1. Hepatic steatosis. 2. Poor visualization of the pancreas due to bowel gas.     Electronically Signed   By: Dasie Hamburg M.D.   -----------------------------   I have personally reviewed the radiology report from 02/03/24 on US  Elastography.  CLINICAL DATA:  Hepatic steatosis with splenomegaly.   EXAM: US  ELASTOGRAPHY HEPATIC   TECHNIQUE: Sonography of the liver was performed. In addition, ultrasound elastography evaluation of the liver was performed. A region of interest was placed within the right lobe of the liver. Following application of a compressive sonographic pulse, tissue compressibility was assessed. Multiple assessments were performed at the selected site. Median tissue compressibility was determined. Previously, hepatic stiffness was assessed by shear wave velocity. Based on recently published Society of Radiologists in Ultrasound consensus  article, reporting is now recommended to be performed in the SI units of pressure (kiloPascals) representing hepatic stiffness/elasticity. The obtained result is compared to the published reference standards. (cACLD = compensated Advanced Chronic Liver Disease)   COMPARISON:  Ultrasound from 01/16/2024.   FINDINGS: Liver: There is poor sound beam penetration to the deep / posterior aspects of the liver as a result of increased hepatic echogenicity which reduces the sensitivity of ultrasound for the detection of focal masses. That being said, no focal mass is identified. Portal vein is patent on color Doppler imaging with normal direction of blood flow towards the liver.   ULTRASOUND HEPATIC ELASTOGRAPHY   Device: Siemens Helix VTQ  Patient position: Supine   Transducer: [id number]   Number of measurements: 10   Hepatic segment:  8   Median kPa: 7.4   IQR: 4.4   IQR/Median kPa ratio: 0.59   Data quality: IQR/Median kPa ratio of 0.3 or greater indicates reduced accuracy   Diagnostic category: < or = 9 kPa: in the absence of other known clinical signs, rules out cACLD   The use of hepatic elastography is applicable to patients with viral hepatitis and non-alcoholic fatty liver disease. At this time, there is insufficient data for the referenced cut-off values and use in other causes of liver disease, including alcoholic liver disease. Patients, however, may be assessed by elastography and serve as their own reference standard/baseline.   In patients with non-alcoholic liver disease, the values suggesting compensated advanced chronic liver disease (cACLD) may be lower, and patients may need additional testing with elasticity results of 7-9 kPa.   Please note that abnormal hepatic elasticity and shear wave velocities may also be identified in clinical settings other than with hepatic fibrosis, such as: acute hepatitis, elevated right heart and central venous pressures  including use of beta blockers, veno-occlusive disease (Budd-Chiari), infiltrative processes such as mastocytosis/amyloidosis/infiltrative tumor/lymphoma, extrahepatic cholestasis, with hyperemia in the post-prandial state, and with liver transplantation. Correlation with patient history, laboratory data, and clinical condition recommended.   Diagnostic Categories:   < or =5 kPa: high probability of being normal   < or =9 kPa: in the absence of other known clinical signs, rules out cACLD   >9 kPa and ?13 kPa: suggestive of cACLD, but needs further testing   >13 kPa: highly suggestive of cACLD   > or =17 kPa: highly suggestive of cACLD with an increased probability of clinically significant portal hypertension   IMPRESSION: ULTRASOUND LIVER:   Increased hepatic echogenicity, a nonspecific finding that is most commonly seen on the basis of steatosis in the absence of known liver disease.   ULTRASOUND HEPATIC ELASTOGRAPHY:   Median kPa:  7.4   Diagnostic category: < or = 9 kPa: in the absence of other known clinical signs, rules out cACLD   In the setting of elevated liver function tests, non-fasting state, or vascular congestion, the stage of liver fibrosis may be overestimated. In some patients with NAFLD, the cut-off values for cACLD may be lower (7-9 kPa). In causes other than viral hepatitis and NAFLD, the cut-off values are not well established.     Electronically Signed   By: Ree Molt M.D.   On: 02/03/2024 16:22  Results for orders placed or performed during the hospital encounter of 02/03/24  Hepatitis C antibody   Collection Time: 02/03/24  9:34 AM  Result Value Ref Range   HCV Ab NON REACTIVE NON REACTIVE  CBC with Differential/Platelet   Collection Time: 02/03/24  9:34 AM  Result Value Ref Range   WBC 7.7 4.0 - 10.5 K/uL   RBC 4.81 4.22 - 5.81 MIL/uL   Hemoglobin 15.1 13.0 - 17.0 g/dL   HCT 55.2 60.9 - 47.9 %   MCV 92.9 80.0 - 100.0 fL   MCH  31.4 26.0 - 34.0 pg   MCHC 33.8 30.0 - 36.0 g/dL   RDW 87.8 88.4 - 84.4 %   Platelets 243 150 - 400 K/uL   nRBC 0.0 0.0 - 0.2 %   Neutrophils Relative % 66 %   Neutro Abs 5.1 1.7 - 7.7 K/uL   Lymphocytes Relative 22 %   Lymphs Abs 1.7 0.7 - 4.0  K/uL   Monocytes Relative 9 %   Monocytes Absolute 0.7 0.1 - 1.0 K/uL   Eosinophils Relative 2 %   Eosinophils Absolute 0.2 0.0 - 0.5 K/uL   Basophils Relative 1 %   Basophils Absolute 0.0 0.0 - 0.1 K/uL   Immature Granulocytes 0 %   Abs Immature Granulocytes 0.02 0.00 - 0.07 K/uL  Technologist smear review   Collection Time: 02/03/24  9:34 AM  Result Value Ref Range   WBC MORPHOLOGY MORPHOLOGY UNREMARKABLE    RBC MORPHOLOGY MORPHOLOGY UNREMARKABLE    Plt Morphology Normal platelet morphology    Clinical Information PATH REVIEW   Hepatitis B surface antibody,quantitative   Collection Time: 02/03/24  9:34 AM  Result Value Ref Range   Hep B S AB Quant (Post) <3.5 (L) Immunity>10 mIU/mL  Protime-INR   Collection Time: 02/03/24  9:34 AM  Result Value Ref Range   Prothrombin Time 13.9 11.4 - 15.2 seconds   INR 1.0 0.8 - 1.2      Assessment & Plan:   Problem List Items Addressed This Visit     Hepatic steatosis - Primary   Hypothyroid   Relevant Medications   levothyroxine  (SYNTHROID ) 75 MCG tablet   Morbid obesity (HCC)   Prediabetes   Splenomegaly     Hepatic steatosis Mild progression since 2018, no cirrhosis or significant liver disease.  FibroScan indicates mild result category, low risk of progression to fibrosis FIB-4 Score 1.26 low risk = advanced fibrosis has been excluded - Focus on weight management, blood sugar control, and cholesterol management. - Consider follow-up imaging in future  Splenomegaly Mild splenomegaly incidentally on imaging, normal blood counts, no hematological disorders. Possible link to metabolic dysfunction. - Monitor blood counts and symptoms. - No immediate intervention  required.  Pre-Diabetes Last A1c 6.2, mild elevated Encourage lifestyle management diet exercise. Re-check in 6 months  Hypothyroidism Controlled Managed with levothyroxine  75 mcg daily, no recent issues. - Refilled levothyroxine  prescription for 90 days with refills. - Re-evaluate thyroid function in 6 months.  Morbid Obesity BMI >38 Encourage continue lifestyle modification with diet exercise weight loss  General Health Maintenance Hepatitis B immunity waned on titer, Discussed booster vaccination. - Consider hepatitis B booster vaccination.   Offer Coronary Artery Calcium Score CT for cardiac screening, he declines today, I suggested within next 6 months can also include imaging on the CT of liver. He will contact back if wants CT scan sooner we can order.      No orders of the defined types were placed in this encounter.   Meds ordered this encounter  Medications   levothyroxine  (SYNTHROID ) 75 MCG tablet    Sig: Take 1 tablet (75 mcg total) by mouth daily before breakfast.    Dispense:  90 tablet    Refill:  3    Follow up plan: Return for 6 month fasting lab > 1 week later Annual Physical.  Future labs ordered for 08/23/24 full lab panel   Marsa Officer, DO [facility] 02/26/2024, 8:10 AM     [1]  Social History Tobacco Use   Smoking status: Never   Smokeless tobacco: Never  Vaping Use   Vaping status: Never Used  Substance Use Topics   Alcohol use: No   Drug use: No   "

## 2024-03-09 ENCOUNTER — Ambulatory Visit: Admitting: Family Medicine

## 2024-06-29 ENCOUNTER — Other Ambulatory Visit

## 2024-07-06 ENCOUNTER — Ambulatory Visit

## 2024-08-23 ENCOUNTER — Other Ambulatory Visit

## 2024-08-30 ENCOUNTER — Encounter: Admitting: Family Medicine
